# Patient Record
Sex: Male | Born: 1990 | Race: Black or African American | Hispanic: No | Marital: Single | Smoking: Current every day smoker
Health system: Southern US, Community
[De-identification: ages and names within clinical notes are randomized; demographics above are authoritative.]

## PROBLEM LIST (undated history)

## (undated) DIAGNOSIS — I10 Essential (primary) hypertension: Secondary | ICD-10-CM

## (undated) DIAGNOSIS — L720 Epidermal cyst: Secondary | ICD-10-CM

## (undated) HISTORY — DX: Epidermal cyst: L72.0

---

## 2014-12-14 ENCOUNTER — Emergency Department (HOSPITAL_COMMUNITY)
Admission: EM | Admit: 2014-12-14 | Discharge: 2014-12-14 | Disposition: A | Discharge status: Home / Self Care | Payer: Self-pay | Attending: Emergency Medicine | Admitting: Emergency Medicine

## 2014-12-14 ENCOUNTER — Encounter (HOSPITAL_COMMUNITY): Payer: Self-pay | Admitting: Emergency Medicine

## 2014-12-14 ENCOUNTER — Emergency Department (HOSPITAL_COMMUNITY): Payer: Self-pay

## 2014-12-14 DIAGNOSIS — K098 Other cysts of oral region, not elsewhere classified: Secondary | ICD-10-CM | POA: Insufficient documentation

## 2014-12-14 DIAGNOSIS — L720 Epidermal cyst: Secondary | ICD-10-CM

## 2014-12-14 DIAGNOSIS — Y92410 Unspecified street and highway as the place of occurrence of the external cause: Secondary | ICD-10-CM | POA: Insufficient documentation

## 2014-12-14 DIAGNOSIS — S92301A Fracture of unspecified metatarsal bone(s), right foot, initial encounter for closed fracture: Secondary | ICD-10-CM

## 2014-12-14 DIAGNOSIS — Y9389 Activity, other specified: Secondary | ICD-10-CM | POA: Insufficient documentation

## 2014-12-14 DIAGNOSIS — Z720 Tobacco use: Secondary | ICD-10-CM | POA: Insufficient documentation

## 2014-12-14 DIAGNOSIS — S92344A Nondisplaced fracture of fourth metatarsal bone, right foot, initial encounter for closed fracture: Secondary | ICD-10-CM | POA: Insufficient documentation

## 2014-12-14 DIAGNOSIS — Y998 Other external cause status: Secondary | ICD-10-CM | POA: Insufficient documentation

## 2014-12-14 MED ORDER — IBUPROFEN 800 MG PO TABS
800.0000 mg | ORAL_TABLET | Freq: Once | ORAL | Status: AC
  Administered 2014-12-14: 800 mg via ORAL
  Filled 2014-12-14: qty 1

## 2014-12-14 MED ORDER — IBUPROFEN 600 MG PO TABS: 600.0000 mg | ORAL_TABLET | Freq: Four times a day (QID) | ORAL | Status: DC | PRN
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
## 2014-12-14 NOTE — Discharge Instructions (Signed)
Wear a cam walker while awake. Follow-up with an orthopedist for further evaluation of your injury. Ice her foot 3-4 times per day. Take ibuprofen as prescribed for pain and swelling. Return to the emergency department as needed if symptoms worsen.  Metatarsal Fracture  A metatarsal fracture is a break (fracture) of one of the bones of the mid-foot (metatarsal bones). The metatarsal bones are responsible for maintaining the arch of the foot. There are three classifications of metatarsal fractures: dancer's fractures, Jones fractures, and stress fractures. A dancer's fracture is when a piece of bone is pulled off by a ligament or tendon (avulsion fracture) of the outer part of the foot (fifth metatarsal), near the joint with the ankle bones. A Jones fracture occurs in the middle of the fifth metatarsal. These fractures have limited ability to heal. A stress fracture occurs when the bone is slowly injured faster than it can repair itself. SYMPTOMS   Sharp pain, especially with standing or walking.  Tenderness, swelling, and later bruising (contusion) of the foot.  Numbness or paralysis from swelling in the foot, causing pressure on the blood vessels or nerves (uncommon). CAUSES  Fractures occur when a force is placed on the bone that is greater than it can handle. Common causes of injury include:  Direct hit (trauma) to the foot.  Twisting injury to the foot or ankle.  Landing on the foot and ankle in an improper position. RISK INCRESES WITH:  Participation in contact sports, sports that require jumping and landing, or sports in which cleats are worn and sliding occurs.  Previous foot or ankle sprains or dislocations.  Repeated injury to any joint in the foot.  Poor strength and flexibility. PREVENTION  Warm up and stretch properly before an activity.  Allow for adequate recovery between workouts.  Maintain physical fitness in:  Strength, flexibility, and  endurance.  Cardiovascular fitness.  When participating in jumping or contact sports, protect joints with supportive devices, such as wrapped elastic bandages, tape, braces, or high-top athletic shoes.  Wear properly fitted and padded protective equipment. PROGNOSIS If treated properly, metatarsal fractures usually heal well. Jones fractures have a higher risk of the bone failing to heal (nonunion). Sometimes, surgery is needed to heal Jones fractures.  RELATED COMPLICATIONS   Nonunion.  Fracture heals in a poor position (malunion).  Long-term (chronic) pain, stiffness, or swelling of the foot.  Excessive bleeding in the foot or at the dislocation site, causing pressure and injury to nerves and blood vessels (rare).  Unstable or arthritic joint following repeated injury or delayed treatment. TREATMENT  Treatment first involves the use of ice and medicine, to reduce pain and inflammation. If the bone fragments are out of alignment (displaced), then immediate realigning of the bones (reduction) is required. Fractures that cannot be realigned by hand, or where the bones protrude through the skin (open), may require surgery to hold the fracture in place with screws, pins, and plates. After the bones are in proper alignment, the foot and ankle must be restrained for 6 or more weeks. Restraint allows healing to occur. After restraint, it is important to perform strengthening and stretching exercises to help regain strength and a full range of motion. These exercises may be completed at home or with a therapist. A stiff-soled shoe and arch support (orthotic) may be required when first returning to sports. MEDICATION   If pain medicine is needed, nonsteroidal anti-inflammatory medicines (NSAIDS), or other minor pain relievers, are often advised.  Do not take pain  medicine for 7 days before surgery.  Only take over-the-counter or prescription medicines for pain, fever, or discomfort as directed by  your caregiver. COLD THERAPY  Cold treatment (icing) should be applied for 10 to 15 minutes every 2 to 3 hours for inflammations and pain, and immediately after activity that aggravates your symptoms. Use ice packs or ice massage. SEEK MEDICAL CARE IF:  Pain, tenderness, or swelling gets worse, despite treatment.  You experience pain, numbness, or coldness in the foot.  Blue, gray, or dark color appears in the toenails.  You or your child has an oral temperature above 102 F (38.9 C).  You have increased pain, swelling, and redness.  You have drainage of fluids or bleeding in the affected area.  New, unexplained symptoms develop. (Drugs used in treatment may produce side effects.)
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
## 2015-10-12 ENCOUNTER — Encounter (HOSPITAL_COMMUNITY): Payer: Self-pay | Admitting: Emergency Medicine

## 2015-10-12 ENCOUNTER — Emergency Department (HOSPITAL_COMMUNITY): Payer: Self-pay

## 2015-10-12 ENCOUNTER — Emergency Department (HOSPITAL_COMMUNITY)
Admission: EM | Admit: 2015-10-12 | Discharge: 2015-10-12 | Disposition: A | Discharge status: Home / Self Care | Payer: Self-pay | Attending: Emergency Medicine | Admitting: Emergency Medicine

## 2015-10-12 DIAGNOSIS — S8991XA Unspecified injury of right lower leg, initial encounter: Secondary | ICD-10-CM | POA: Insufficient documentation

## 2015-10-12 DIAGNOSIS — F17200 Nicotine dependence, unspecified, uncomplicated: Secondary | ICD-10-CM | POA: Insufficient documentation

## 2015-10-12 DIAGNOSIS — W500XXA Accidental hit or strike by another person, initial encounter: Secondary | ICD-10-CM | POA: Insufficient documentation

## 2015-10-12 DIAGNOSIS — Y9367 Activity, basketball: Secondary | ICD-10-CM | POA: Insufficient documentation

## 2015-10-12 DIAGNOSIS — Y998 Other external cause status: Secondary | ICD-10-CM | POA: Insufficient documentation

## 2015-10-12 DIAGNOSIS — M25561 Pain in right knee: Secondary | ICD-10-CM

## 2015-10-12 DIAGNOSIS — Y9289 Other specified places as the place of occurrence of the external cause: Secondary | ICD-10-CM | POA: Insufficient documentation

## 2015-10-12 MED ORDER — OXYCODONE-ACETAMINOPHEN 5-325 MG PO TABS
1.0000 | ORAL_TABLET | Freq: Once | ORAL | Status: AC
  Administered 2015-10-12: 1 via ORAL

## 2015-10-12 MED ORDER — OXYCODONE-ACETAMINOPHEN 5-325 MG PO TABS
ORAL_TABLET | ORAL | Status: AC
  Filled 2015-10-12: qty 1

## 2015-10-12 MED ORDER — NAPROXEN 500 MG PO TABS: 500.0000 mg | ORAL_TABLET | Freq: Two times a day (BID) | ORAL | Status: DC

## 2015-10-12 NOTE — ED Notes (Signed)
PA at bedside.

## 2015-10-12 NOTE — ED Provider Notes (Signed)
CSN: [id number]     Arrival date & time 10/12/15  0300 History   First MD Initiated Contact with Patient 10/12/15 [phone]     Chief Complaint  Patient presents with  . Knee Injury     (Consider location/radiation/quality/duration/timing/severity/associated sxs/prior Treatment) The history is provided by the patient and medical records. No language interpreter was used.     Alex Cox is a 25 y.o. male  with no pertinent PMH who presents to the Emergency Department complaining of acute onset of constant, moderate right knee pain x 1 day. Patient states yesterday he was playing basketball when another player hit his medial right knee causing him to fall. Patient states immediate pain, but no immediate swelling. Able to ambulate following the event. Aggravated by movement and weightbearing. Alleviated with rest. No medication taken prior to arrival. No history of prior injuries to this knee.  History reviewed. No pertinent past medical history. History reviewed. No pertinent past surgical history. Family History  Problem Relation Age of Onset  . Hypertension Other    Social History  Substance Use Topics  . Smoking status: Current Every Day Smoker  . Smokeless tobacco: None  . Alcohol Use: Yes    Review of Systems  Constitutional: Negative for fever and chills.  HENT: Negative for congestion and sore throat.   Eyes: Negative for visual disturbance.  Respiratory: Negative for cough and shortness of breath.   Cardiovascular: Negative.   Gastrointestinal: Negative for abdominal pain.  Genitourinary: Negative for dysuria.  Musculoskeletal: Positive for arthralgias.  Skin: Negative for color change.  Neurological: Negative for dizziness and weakness.      Allergies  Review of patient's allergies indicates no known allergies.  Home Medications   Prior to Admission medications   Medication Sig Start Date End Date Taking? Authorizing Provider  ibuprofen (ADVIL,MOTRIN) 600  MG tablet Take 1 tablet (600 mg total) by mouth every 6 (six) hours as needed. 12/14/14   Antony MaduraKelly Humes, PA-C  naproxen (NAPROSYN) 500 MG tablet Take 1 tablet (500 mg total) by mouth 2 (two) times daily. 10/12/15   Tyauna Lacaze Pilcher Annalese Stiner, PA-C   BP 148/107 mmHg  Pulse 84  Temp(Src) 98.9 F (37.2 C) (Oral)  Resp 14  Ht 6' (1.829 m)  Wt 71.668 kg  BMI 21.42 kg/m2  SpO2 99% Physical Exam  Constitutional: He is oriented to person, place, and time. He appears well-developed and well-nourished.  Alert and in no acute distress  HENT:  Head: Normocephalic and atraumatic.  Cardiovascular: Normal rate, regular rhythm and normal heart sounds.  Exam reveals no gallop and no friction rub.   No murmur heard. Pulmonary/Chest: Effort normal and breath sounds normal. No respiratory distress. He has no wheezes. He has no rales.  Musculoskeletal:  Right knee: No gross deformity noted. TTP along MCL. No varus/valgus laxity. Knee with full ROM. There is no joint effusion or swelling appreciated. No abnormal alignment or patellar mobility. No bruising, erythema, or warmth overlaying the joint.  Negative drawer's, negative Lachman's, negative McMurray's, no crepitus.  2+ DP pulse's bilaterally. All compartments are soft. Sensation intact distal to injury.  Neurological: He is alert and oriented to person, place, and time.  Skin: Skin is warm and dry.  Psychiatric: He has a normal mood and affect. His behavior is normal. Judgment and thought content normal.  Nursing note and vitals reviewed.   ED Course  Procedures (including critical care time) Labs Review Labs Reviewed - No data to display  Imaging Review Dg  Knee Complete 4 Views Right  10/12/2015  CLINICAL DATA:  Basketball injury yesterday. Medial right knee pain and swelling. EXAM: RIGHT KNEE - COMPLETE 4+ VIEW COMPARISON:  None. FINDINGS: There is no evidence of fracture, dislocation, or joint effusion. There is no evidence of arthropathy or other focal  bone abnormality. Soft tissues are unremarkable. IMPRESSION: Negative. Electronically Signed   By: Burman Nieves M.D.   On: 10/12/2015 04:39   I have personally reviewed and evaluated these images and lab results as part of my medical decision-making.   EKG Interpretation None      MDM   Final diagnoses:  Right knee pain   Alex Bridgeman presents with right knee pain secondary to injury yesterday while playing basketball. No immediate swelling and patient was able to ambulate immediately after incident. Patient is ambulatory in ED. X-rays were obtained which were negative. On exam, the contents intact. No valgus or varus laxity, however patient was tender to palpation over the MCL. Knee immobilizer placed in ED. Patient was given home care instructions. Orthopedic follow-up was discussed and strongly encouraged - referral given. Return precautions given. All questions answered.  [facility] Deedee Lybarger, PA-C 10/12/15 0734  Laurence Spates, MD 10/12/15 [phone]

## 2015-10-12 NOTE — Discharge Instructions (Signed)
Wear knee immobilizer for at least 2 weeks for stabilization of knee. Ice and elevate knee throughout the day. Naproxen for pain relief. Call orthopedic clinic today to schedule follow up appointment for recheck of ongoing knee pain in one week. That appointment can be canceled with a 24-48 hour notice if complete resolution of pain. Return to ER for new or worsening symptoms, any additional concerns.   COLD THERAPY DIRECTIONS:  Ice or gel packs can be used to reduce both pain and swelling. Ice is the most helpful within the first 24 to 48 hours after an injury or flareup from overusing a muscle or joint.  Ice is effective, has very few side effects, and is safe for most people to use.   If you expose your skin to cold temperatures for too long or without the proper protection, you can damage your skin or nerves. Watch for signs of skin damage due to cold.   HOME CARE INSTRUCTIONS  Follow these tips to use ice and cold packs safely.  Place a dry or damp towel between the ice and skin. A damp towel will cool the skin more quickly, so you may need to shorten the time that the ice is used.  For a more rapid response, add gentle compression to the ice.  Ice for no more than 10 to 20 minutes at a time. The bonier the area you are icing, the less time it will take to get the benefits of ice.  Check your skin after 5 minutes to make sure there are no signs of a poor response to cold or skin damage.  Rest 20 minutes or more in between uses.  Once your skin is numb, you can end your treatment. You can test numbness by very lightly touching your skin. The touch should be so light that you do not see the skin dimple from the pressure of your fingertip. When using ice, most people will feel these normal sensations in this order: cold, burning, aching, and numbness.

## 2015-10-12 NOTE — ED Notes (Signed)
Pt. injured his right knee while playing basketball ( collided with another player) yesterday , ambulatory , presents with pain and swelling .

## 2015-11-25 ENCOUNTER — Encounter (HOSPITAL_COMMUNITY): Payer: Self-pay

## 2015-11-25 ENCOUNTER — Emergency Department (HOSPITAL_COMMUNITY)
Admission: EM | Admit: 2015-11-25 | Discharge: 2015-11-25 | Disposition: A | Discharge status: Home / Self Care | Payer: Self-pay | Attending: Emergency Medicine | Admitting: Emergency Medicine

## 2015-11-25 DIAGNOSIS — S91311A Laceration without foreign body, right foot, initial encounter: Secondary | ICD-10-CM | POA: Insufficient documentation

## 2015-11-25 DIAGNOSIS — Z791 Long term (current) use of non-steroidal anti-inflammatories (NSAID): Secondary | ICD-10-CM | POA: Insufficient documentation

## 2015-11-25 DIAGNOSIS — Y9389 Activity, other specified: Secondary | ICD-10-CM | POA: Insufficient documentation

## 2015-11-25 DIAGNOSIS — S91114A Laceration without foreign body of right lesser toe(s) without damage to nail, initial encounter: Secondary | ICD-10-CM | POA: Insufficient documentation

## 2015-11-25 DIAGNOSIS — W25XXXA Contact with sharp glass, initial encounter: Secondary | ICD-10-CM | POA: Insufficient documentation

## 2015-11-25 DIAGNOSIS — Y9289 Other specified places as the place of occurrence of the external cause: Secondary | ICD-10-CM | POA: Insufficient documentation

## 2015-11-25 DIAGNOSIS — F17200 Nicotine dependence, unspecified, uncomplicated: Secondary | ICD-10-CM | POA: Insufficient documentation

## 2015-11-25 DIAGNOSIS — Y998 Other external cause status: Secondary | ICD-10-CM | POA: Insufficient documentation

## 2015-11-25 DIAGNOSIS — Z23 Encounter for immunization: Secondary | ICD-10-CM | POA: Insufficient documentation

## 2015-11-25 MED ORDER — IBUPROFEN 600 MG PO TABS: 600.0000 mg | ORAL_TABLET | Freq: Four times a day (QID) | ORAL | Status: DC | PRN

## 2015-11-25 MED ORDER — IBUPROFEN 800 MG PO TABS
800.0000 mg | ORAL_TABLET | Freq: Once | ORAL | Status: AC
  Administered 2015-11-25: 800 mg via ORAL
  Filled 2015-11-25: qty 1

## 2015-11-25 MED ORDER — LIDOCAINE-EPINEPHRINE 2 %-1:100000 IJ SOLN: 20.0000 mL | Freq: Once | INTRAMUSCULAR | Status: DC

## 2015-11-25 MED ORDER — TETANUS-DIPHTH-ACELL PERTUSSIS 5-2.5-18.5 LF-MCG/0.5 IM SUSP
0.5000 mL | Freq: Once | INTRAMUSCULAR | Status: AC
  Administered 2015-11-25: 0.5 mL via INTRAMUSCULAR
  Filled 2015-11-25: qty 0.5

## 2015-11-25 MED ORDER — LIDOCAINE-EPINEPHRINE (PF) 2 %-1:200000 IJ SOLN
INTRAMUSCULAR | Status: AC
  Administered 2015-11-25: 20 mL
  Filled 2015-11-25: qty 20

## 2015-11-25 NOTE — ED Notes (Signed)
PT DISCHARGED. INSTRUCTIONS AND PRESCRIPTION GIVEN. AAOX3. PT IN NO APPARENT DISTRESS OR PAIN. THE OPPORTUNITY TO ASK QUESTIONS WAS PROVIDED. 

## 2015-11-25 NOTE — Discharge Instructions (Signed)
Please have your sutures removed in 7 days.  Return sooner if you notice signs of infection.  Take ibuprofen as needed for pain.   Laceration Care, Adult A laceration is a cut that goes through all of the layers of the skin and into the tissue that is right under the skin. Some lacerations heal on their own. Others need to be closed with stitches (sutures), staples, skin adhesive strips, or skin glue. Proper laceration care minimizes the risk of infection and helps the laceration to heal better. HOW TO CARE FOR YOUR LACERATION If sutures or staples were used:  Keep the wound clean and dry.  If you were given a bandage (dressing), you should change it at least one time per day or as told by your health care provider. You should also change it if it becomes wet or dirty.  Keep the wound completely dry for the first 24 hours or as told by your health care provider. After that time, you may shower or bathe. However, make sure that the wound is not soaked in water until after the sutures or staples have been removed.  Clean the wound one time each day or as told by your health care provider:  Wash the wound with soap and water.  Rinse the wound with water to remove all soap.  Pat the wound dry with a clean towel. Do not rub the wound.  After cleaning the wound, apply a thin layer of antibiotic ointmentas told by your health care provider. This will help to prevent infection and keep the dressing from sticking to the wound.  Have the sutures or staples removed as told by your health care provider. If skin adhesive strips were used:  Keep the wound clean and dry.  If you were given a bandage (dressing), you should change it at least one time per day or as told by your health care provider. You should also change it if it becomes dirty or wet.  Do not get the skin adhesive strips wet. You may shower or bathe, but be careful to keep the wound dry.  If the wound gets wet, pat it dry with a  clean towel. Do not rub the wound.  Skin adhesive strips fall off on their own. You may trim the strips as the wound heals. Do not remove skin adhesive strips that are still stuck to the wound. They will fall off in time. If skin glue was used:  Try to keep the wound dry, but you may briefly wet it in the shower or bath. Do not soak the wound in water, such as by swimming.  After you have showered or bathed, gently pat the wound dry with a clean towel. Do not rub the wound.  Do not do any activities that will make you sweat heavily until the skin glue has fallen off on its own.  Do not apply liquid, cream, or ointment medicine to the wound while the skin glue is in place. Using those may loosen the film before the wound has healed.  If you were given a bandage (dressing), you should change it at least one time per day or as told by your health care provider. You should also change it if it becomes dirty or wet.  If a dressing is placed over the wound, be careful not to apply tape directly over the skin glue. Doing that may cause the glue to be pulled off before the wound has healed.  Do not pick at  the glue. The skin glue usually remains in place for 5-10 days, then it falls off of the skin. General Instructions  Take over-the-counter and prescription medicines only as told by your health care provider.  If you were prescribed an antibiotic medicine or ointment, take or apply it as told by your doctor. Do not stop using it even if your condition improves.  To help prevent scarring, make sure to cover your wound with sunscreen whenever you are outside after stitches are removed, after adhesive strips are removed, or when glue remains in place and the wound is healed. Make sure to wear a sunscreen of at least 30 SPF.  Do not scratch or pick at the wound.  Keep all follow-up visits as told by your health care provider. This is important.  Check your wound every day for signs of infection.  Watch for:  Redness, swelling, or pain.  Fluid, blood, or pus.  Raise (elevate) the injured area above the level of your heart while you are sitting or lying down, if possible. SEEK MEDICAL CARE IF:  You received a tetanus shot and you have swelling, severe pain, redness, or bleeding at the injection site.  You have a fever.  A wound that was closed breaks open.  You notice a bad smell coming from your wound or your dressing.  You notice something coming out of the wound, such as wood or glass.  Your pain is not controlled with medicine.  You have increased redness, swelling, or pain at the site of your wound.  You have fluid, blood, or pus coming from your wound.  You notice a change in the color of your skin near your wound.  You need to change the dressing frequently due to fluid, blood, or pus draining from the wound.  You develop a new rash.  You develop numbness around the wound. SEEK IMMEDIATE MEDICAL CARE IF:  You develop severe swelling around the wound.  Your pain suddenly increases and is severe.  You develop painful lumps near the wound or on skin that is anywhere on your body.  You have a red streak going away from your wound.  The wound is on your hand or foot and you cannot properly move a finger or toe.  The wound is on your hand or foot and you notice that your fingers or toes look pale or bluish.   This information is not intended to replace advice given to you by your health care provider. Make sure you discuss any questions you have with your health care provider.   Document Released: 07/21/2005 Document Revised: 12/05/2014 Document Reviewed: 07/17/2014 Elsevier Interactive Patient Education [employer]
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
## 2015-11-25 NOTE — ED Provider Notes (Signed)
CSN: [id number]     Arrival date & time 11/25/15  1515 History  By signing my name below, I, Linna Darner, attest that this documentation has been prepared under the direction and in the presence of non-physician practitioner, Fayrene Helper, PA-C. Electronically Signed: Linna Darner, Scribe. 11/25/2015. 3:19 PM.    Chief Complaint  Patient presents with  . Extremity Laceration    LEFT FOOT    The history is provided by the patient. No language interpreter was used.     HPI Comments: Alex Cox is a 25 y.o. male brought in by EMS with no pertinent PMHx who presents to the Emergency Department complaining of 3 bottom right foot lacerations s/p stepping on a glass ash tray several hours ago. He reports that the ash tray broke when he stepped on it and punctured the bottom of his right foot in several places; he has one laceration on his 2nd right digit, one on the ball of his right foot, and one on the mid-sole of his right foot. Pt states that it does not feel like there is glass in his lacerations. He endorses 7/10 pain and mild numbness in his right foot; he is able to move his right toes. He has no known allergies to medication. His bleeding is controlled at this time. Pt is not UTD for tetanus. He denies other pains, injuries, or associated symptoms.  No past medical history on file. No past surgical history on file. Family History  Problem Relation Age of Onset  . Hypertension Other    Social History  Substance Use Topics  . Smoking status: Current Every Day Smoker  . Smokeless tobacco: Not on file  . Alcohol Use: Yes    Review of Systems  Musculoskeletal: Positive for arthralgias (right foot).  Skin: Positive for wound (right foot).  Neurological: Positive for numbness.   Allergies  Review of patient's allergies indicates no known allergies.  Home Medications   Prior to Admission medications   Medication Sig Start Date End Date Taking? Authorizing Provider   ibuprofen (ADVIL,MOTRIN) 600 MG tablet Take 1 tablet (600 mg total) by mouth every 6 (six) hours as needed. 12/14/14   Antony Madura, PA-C  naproxen (NAPROSYN) 500 MG tablet Take 1 tablet (500 mg total) by mouth 2 (two) times daily. 10/12/15   Chase Picket Ward, PA-C   There were no vitals taken for this visit. Physical Exam  Constitutional: He is oriented to person, place, and time. He appears well-developed and well-nourished. No distress.  HENT:  Head: Normocephalic and atraumatic.  Eyes: Conjunctivae and EOM are normal.  Neck: Neck supple. No tracheal deviation present.  Cardiovascular: Normal rate.   Pulmonary/Chest: Effort normal. No respiratory distress.  Musculoskeletal: Normal range of motion.  2 cm superficial laceration noted to the mid-sole of right foot without any foreign object noted, not actively bleeding, depth of 4 mm 2.5 cm oblique laceration noted to the ball of the right foot, approximately 3 mm in depth without any foreign object noted and not actively bleeding 3 cm linear superficial laceration noted to the medial aspect of the 2nd toe without foreign object noted, 2 mm in depth, not actively bleeding  Neurological: He is alert and oriented to person, place, and time.  Skin: Skin is warm and dry.  Psychiatric: He has a normal mood and affect. His behavior is normal.  Nursing note and vitals reviewed.   ED Course  Procedures (including critical care time)  DIAGNOSTIC STUDIES: Oxygen Saturation is 99%  on RA, normal by my interpretation.    COORDINATION OF CARE: 3:19 PM Will administer ibuprofen 800 mg tablet and Xylocaine w/ Epi injection. Will order suture cart. Discussed treatment plan with pt at bedside and pt agreed to plan.  LACERATION REPAIR Performed by: Fayrene HelperBowie Alda Gaultney Consent: Verbal consent obtained. Risks and benefits: risks, benefits and alternatives were discussed Patient identity confirmed: provided demographic data Time out performed prior to  procedure Prepped and Draped in normal sterile fashion Wound explored Laceration Location: R foot, mid sole Laceration Length: 2cm No Foreign Bodies seen or palpated Anesthesia: local infiltration Local anesthetic: lidocaine 2% w epinephrine Anesthetic total: 2 ml Irrigation method: syringe Amount of cleaning: standard Skin closure: prolene 5.0 Number of sutures or staples: 4 Technique: simple interrupted Patient tolerance: Patient tolerated the procedure well with no immediate complications.  LACERATION REPAIR Performed by: Fayrene HelperRAN,Elaine Middleton Authorized byFayrene Helper: Brysten Reister Consent: Verbal consent obtained. Risks and benefits: risks, benefits and alternatives were discussed Consent given by: patient Patient identity confirmed: provided demographic data Prepped and Draped in normal sterile fashion Wound explored  Laceration Location: R foot, ball of foot  Laceration Length: 2.5cm  No Foreign Bodies seen or palpated  Anesthesia: local infiltration  Local anesthetic: lidocaine 2% w epinephrine  Anesthetic total: 2 ml  Irrigation method: syringe Amount of cleaning: standard  Skin closure: prolene 5.0  Number of sutures: 5  Technique: simple interrupted  Patient tolerance: Patient tolerated the procedure well with no immediate complications.  LACERATION REPAIR Performed by: Fayrene HelperRAN,Nakima Fluegge Authorized byFayrene Helper: Laurna Shetley Consent: Verbal consent obtained. Risks and benefits: risks, benefits and alternatives were discussed Consent given by: patient Patient identity confirmed: provided demographic data Prepped and Draped in normal sterile fashion Wound explored  Laceration Location: R foot, 2nd toe, medial aspect  Laceration Length: 3cm  No Foreign Bodies seen or palpated  Anesthesia: local infiltration  Local anesthetic: lidocaine 2% w epinephrine  Anesthetic total: 1 ml  Irrigation method: syringe Amount of cleaning: standard  Skin closure: sterile strip  Number of  sutures: sterile strip  Technique: sterile strip  Patient tolerance: Patient tolerated the procedure well with no immediate complications.     MDM   Final diagnoses:  Foot laceration, right, initial encounter   BP 135/91 mmHg  Pulse 72  Temp(Src) 98.3 F (36.8 C) (Oral)  Resp 16  Ht 6' (1.829 m)  Wt 70.308 kg  BMI 21.02 kg/m2  SpO2 99%  I personally performed the services described in this documentation, which was scribed in my presence. The recorded information has been reviewed and is accurate.     Fayrene HelperBowie Vernell Townley, PA-C 11/25/15 1619  Lavera Guiseana Duo Liu, MD 11/26/15 1149

## 2015-11-25 NOTE — ED Notes (Signed)
Bed: [id number] Expected date:  Expected time:  Means of arrival:  Comments: EMS- foot lac

## 2015-12-02 ENCOUNTER — Encounter (HOSPITAL_COMMUNITY): Payer: Self-pay | Admitting: *Deleted

## 2015-12-02 ENCOUNTER — Emergency Department (HOSPITAL_COMMUNITY)
Admission: EM | Admit: 2015-12-02 | Discharge: 2015-12-02 | Disposition: A | Discharge status: Home / Self Care | Payer: Self-pay | Attending: Emergency Medicine | Admitting: Emergency Medicine

## 2015-12-02 DIAGNOSIS — Z4802 Encounter for removal of sutures: Secondary | ICD-10-CM | POA: Insufficient documentation

## 2015-12-02 DIAGNOSIS — F17200 Nicotine dependence, unspecified, uncomplicated: Secondary | ICD-10-CM | POA: Insufficient documentation

## 2015-12-02 NOTE — ED Notes (Signed)
Pt told registration he does not want to wait longer, states he will come back tomorrow.

## 2015-12-02 NOTE — ED Notes (Signed)
Pt here for suture removal. Pt has sutures on bottom of right foot. Pt denies drainage from suture site.

## 2015-12-03 ENCOUNTER — Emergency Department (HOSPITAL_COMMUNITY)
Admission: EM | Admit: 2015-12-03 | Discharge: 2015-12-03 | Disposition: A | Discharge status: Home / Self Care | Payer: Self-pay | Attending: Emergency Medicine | Admitting: Emergency Medicine

## 2015-12-03 ENCOUNTER — Encounter (HOSPITAL_COMMUNITY): Payer: Self-pay

## 2015-12-03 DIAGNOSIS — Z4802 Encounter for removal of sutures: Secondary | ICD-10-CM

## 2015-12-03 DIAGNOSIS — Z791 Long term (current) use of non-steroidal anti-inflammatories (NSAID): Secondary | ICD-10-CM | POA: Insufficient documentation

## 2015-12-03 NOTE — Discharge Instructions (Signed)
Suture Removal, Care After Refer to this sheet in the next few weeks. These instructions provide you with information on caring for yourself after your procedure. Your health care provider may also give you more specific instructions. Your treatment has been planned according to current medical practices, but problems sometimes occur. Call your health care provider if you have any problems or questions after your procedure. WHAT TO EXPECT AFTER THE PROCEDURE After your stitches (sutures) are removed, it is typical to have the following:  Some discomfort and swelling in the wound area.  Slight redness in the area. HOME CARE INSTRUCTIONS   If you have skin adhesive strips over the wound area, do not take the strips off. They will fall off on their own in a few days. If the strips remain in place after 14 days, you may remove them.  Change any bandages (dressings) at least once a day or as directed by your health care provider. If the bandage sticks, soak it off with warm, soapy water.  Apply cream or ointment only as directed by your health care provider. If using cream or ointment, wash the area with soap and water 2 times a day to remove all the cream or ointment. Rinse off the soap and pat the area dry with a clean towel.  Keep the wound area dry and clean. If the bandage becomes wet or dirty, or if it develops a bad smell, change it as soon as possible.  Continue to protect the wound from injury.  Use sunscreen when out in the sun. New scars become sunburned easily. SEEK MEDICAL CARE IF:  You have increasing redness, swelling, or pain in the wound.  You see pus coming from the wound.  You have a fever.  You notice a bad smell coming from the wound or dressing.  Your wound breaks open (edges not staying together).   This information is not intended to replace advice given to you by your health care provider. Make sure you discuss any questions you have with your health care  provider.   Apply antibiotic ointment to wounds daily. Keep covered with a bandaid while wearing a shoe. Return to the ED if you experience redness or swelling around your wound, fevers, chills.

## 2015-12-03 NOTE — ED Provider Notes (Signed)
CSN: [id number]     Arrival date & time 12/03/15  1109 History  By signing my name below, I, Alex BeardChristian Cox, attest that this documentation has been prepared under the direction and in the presence of Texas InstrumentsSamantha Tripp Naly Schwanz, PA-C.   Electronically Signed: Iona Beardhristian Cox, ED Scribe 12/03/2015 at 12:20 PM.  Chief Complaint  Patient presents with  . Suture / Staple Removal    The history is provided by the patient. No language interpreter was used.   HPI Comments: Alex Cox is a 25 y.o. male with no pertinent PMHx who presents to the Emergency Department for suture removal on the bottom of his right foot. Pt reports the sutures were put in one week ago after he stepped on glass. No other associated symptoms noted. No worsening or alleviating factors noted. Pt denies fever, erythema, drainage, or any other pertinent symptoms. Pt is UTD on his tetanus.   History reviewed. No pertinent past medical history. History reviewed. No pertinent past surgical history. Family History  Problem Relation Age of Onset  . Hypertension Other    Social History  Substance Use Topics  . Smoking status: Current Every Day Smoker  . Smokeless tobacco: None  . Alcohol Use: Yes    Review of Systems  Constitutional: Negative for fever.  Skin: Negative for color change.       Sutures to bottom of right foot  All other systems reviewed and are negative.    Allergies  Review of patient's allergies indicates no known allergies.  Home Medications   Prior to Admission medications   Medication Sig Start Date End Date Taking? Authorizing Provider  ibuprofen (ADVIL,MOTRIN) 600 MG tablet Take 1 tablet (600 mg total) by mouth every 6 (six) hours as needed. 11/25/15   Fayrene HelperBowie Tran, PA-C  naproxen (NAPROSYN) 500 MG tablet Take 1 tablet (500 mg total) by mouth 2 (two) times daily. 10/12/15   Jaime Pilcher Ward, PA-C   BP 130/96 mmHg  Pulse 78  Temp(Src) 98.3 F (36.8 C)  Resp 16  Ht 6' (1.829 m)  Wt  155 lb (70.308 kg)  BMI 21.02 kg/m2  SpO2 99% Physical Exam  Constitutional: He is oriented to person, place, and time. He appears well-developed and well-nourished. No distress.  HENT:  Head: Normocephalic and atraumatic.  Eyes: Conjunctivae are normal. Right eye exhibits no discharge. Left eye exhibits no discharge. No scleral icterus.  Cardiovascular: Normal rate.   Pulmonary/Chest: Effort normal.  Neurological: He is alert and oriented to person, place, and time. Coordination normal.  Skin: Skin is warm and dry. No rash noted. He is not diaphoretic. No erythema. No pallor.  9 sutures on the sole of right foot. Wound appears well healed. No erythema or swelling. Wound clean, dry, and intact.   Psychiatric: He has a normal mood and affect. His behavior is normal.  Nursing note and vitals reviewed.   ED Course  Procedures (including critical care time) DIAGNOSTIC STUDIES: Oxygen Saturation is 99% on RA, normal by my interpretation.    COORDINATION OF CARE: 12:20 PM Discussed treatment plan with pt at bedside and pt agreed to plan.  Labs Review Labs Reviewed - No data to display  Imaging Review No results found.   EKG Interpretation None      MDM    Final diagnoses:  Visit for suture removal   Pt to ER for staple/suture removal and wound check as above. Procedure tolerated well. Vitals normal, no signs of infection. Scar minimization & return precautions  given at dc.    I personally performed the services described in this documentation, which was scribed in my presence. The recorded information has been reviewed and is accurate.       Lester Kinsman Ladora, PA-C 12/03/15 1303

## 2015-12-03 NOTE — ED Notes (Signed)
Patient here for suture removal of the right foot.

## 2015-12-03 NOTE — ED Notes (Signed)
Bed: [facility] Expected date:  Expected time:  Means of arrival:  Comments: 

## 2016-02-14 NOTE — ED Provider Notes (Signed)
Medical screening examination/treatment/procedure(s) were performed by non-physician practitioner and as supervising physician I was immediately available for consultation/collaboration.   EKG Interpretation None       Ashton Sabine, MD 02/14/16 1301 

## 2016-03-04 ENCOUNTER — Emergency Department (HOSPITAL_COMMUNITY)
Admission: EM | Admit: 2016-03-04 | Discharge: 2016-03-04 | Disposition: A | Discharge status: Home / Self Care | Payer: Self-pay | Attending: Emergency Medicine | Admitting: Emergency Medicine

## 2016-03-04 ENCOUNTER — Encounter (HOSPITAL_COMMUNITY): Payer: Self-pay | Admitting: *Deleted

## 2016-03-04 DIAGNOSIS — F1290 Cannabis use, unspecified, uncomplicated: Secondary | ICD-10-CM | POA: Insufficient documentation

## 2016-03-04 DIAGNOSIS — L723 Sebaceous cyst: Secondary | ICD-10-CM | POA: Insufficient documentation

## 2016-03-04 DIAGNOSIS — Z791 Long term (current) use of non-steroidal anti-inflammatories (NSAID): Secondary | ICD-10-CM | POA: Insufficient documentation

## 2016-03-04 DIAGNOSIS — F17200 Nicotine dependence, unspecified, uncomplicated: Secondary | ICD-10-CM | POA: Insufficient documentation

## 2016-03-04 NOTE — ED Provider Notes (Signed)
[facility]-EMERGENCY DEPT Provider Note   CSN: [id number] Arrival date & time: 03/04/16  1718  First Provider Contact:  None    By signing my name below, I, Alex Cox, attest that this documentation has been prepared under the direction and in the presence of non-physician practitioner, Alex Fowler, PA-C. Electronically Signed: Majel Cox, Scribe. 03/04/2016. 7:31 PM.  History   Chief Complaint Chief Complaint  Patient presents with  . Cyst   The history is provided by the patient. No language interpreter was used.    HPI Comments: Alex Cox is a 25 y.o. male who presents to the Emergency Department complaining of gradually worsening, constant, non-painful knot on the top of his head that appeared ~1 year ago and has become larger over the course of the last year. Pt reports he believes it is causing his headaches at night; he denies a headache now in the ED. He also denies any drainage from the knot and associated pain. No numbness or weakness.  No prior treatment.  No modifying or aggravating factors.   History reviewed. No pertinent past medical history.  There are no active problems to display for this patient.   History reviewed. No pertinent surgical history.   Home Medications    Prior to Admission medications   Medication Sig Start Date End Date Taking? Authorizing Provider  ibuprofen (ADVIL,MOTRIN) 600 MG tablet Take 1 tablet (600 mg total) by mouth every 6 (six) hours as needed. 11/25/15   Fayrene Helper, PA-C  naproxen (NAPROSYN) 500 MG tablet Take 1 tablet (500 mg total) by mouth 2 (two) times daily. 10/12/15   Chase Picket Ward, PA-C    Family History Family History  Problem Relation Age of Onset  . Hypertension Other     Social History Social History  Substance Use Topics  . Smoking status: Current Every Day Smoker  . Smokeless tobacco: Never Used  . Alcohol use Yes     Allergies   Review of patient's allergies indicates no known allergies.   Review  of Systems Review of Systems  Constitutional: Negative for fever.  Neurological: Positive for headaches.  All other systems reviewed and are negative.  Physical Exam Updated Vital Signs BP (!) 139/102 (BP Location: Right Arm)   Pulse 83   Temp 98.9 F (37.2 C) (Oral)   Resp 18   SpO2 100%   Physical Exam  Constitutional: He is oriented to person, place, and time. He appears well-developed and well-nourished.  HENT:  Head: Normocephalic and atraumatic.  Right Ear: External ear normal.  Left Ear: External ear normal.  1x1 cm non-tender, mobile, rubbery-soft mass on middle upper forehead.  No overlying erythema, warmth, induration, or fluctuance.   Eyes: Conjunctivae are normal. No scleral icterus.  Neck: No tracheal deviation present.  Pulmonary/Chest: Effort normal. No respiratory distress.  Abdominal: He exhibits no distension.  Musculoskeletal: Normal range of motion.  Neurological: He is alert and oriented to person, place, and time.  Skin: Skin is warm and dry.  Psychiatric: He has a normal mood and affect. His behavior is normal.   ED Treatments / Results  Labs (all labs ordered are listed, but only abnormal results are displayed) Labs Reviewed - No data to display  EKG  EKG Interpretation None       Radiology No results found.  Procedures Procedures  DIAGNOSTIC STUDIES:  Oxygen Saturation is 100% on RA, normal by my interpretation.    COORDINATION OF CARE:  7:31 PM Discussed treatment plan with pt  at bedside and pt agreed to plan.   Medications Ordered in ED Medications - No data to display   Initial Impression / Assessment and Plan / ED Course  I have reviewed the triage vital signs and the nursing notes.  Pertinent labs & imaging results that were available during my care of the patient were reviewed by me and considered in my medical decision making (see chart for details).  Clinical Course   Patient presents with likely lipoma versus  sebaceous cyst.  No signs of infection.  No indication for removal at this time.  Will refer to general surgery for elective removal.  Return precautions discussed.  Patient agrees and acknowledges the above plan for discharge.    Final Clinical Impressions(s) / ED Diagnoses   Final diagnoses:  Sebaceous cyst    New Prescriptions New Prescriptions   No medications on file   I personally performed the services described in this documentation, which was scribed in my presence. The recorded information has been reviewed and is accurate.     Alex Fowler, PA-C 03/04/16 1948    Rolan Bucco, MD 03/04/16 [phone]

## 2016-03-04 NOTE — ED Triage Notes (Signed)
Pt complains of knot on his upper forehead for the past year, which has gotten bigger over the past month. Pt states he thinks the knot is giving him headaches. Pt denies headache at this time. Pt denies drainage from knot.

## 2016-03-17 ENCOUNTER — Emergency Department (HOSPITAL_COMMUNITY)
Admission: EM | Admit: 2016-03-17 | Discharge: 2016-03-17 | Disposition: A | Discharge status: Home / Self Care | Payer: Self-pay | Attending: Emergency Medicine | Admitting: Emergency Medicine

## 2016-03-17 ENCOUNTER — Emergency Department (HOSPITAL_COMMUNITY): Payer: Self-pay

## 2016-03-17 ENCOUNTER — Encounter (HOSPITAL_COMMUNITY): Payer: Self-pay | Admitting: Emergency Medicine

## 2016-03-17 DIAGNOSIS — Y999 Unspecified external cause status: Secondary | ICD-10-CM | POA: Insufficient documentation

## 2016-03-17 DIAGNOSIS — R569 Unspecified convulsions: Secondary | ICD-10-CM | POA: Insufficient documentation

## 2016-03-17 DIAGNOSIS — S0083XA Contusion of other part of head, initial encounter: Secondary | ICD-10-CM | POA: Insufficient documentation

## 2016-03-17 DIAGNOSIS — W1830XA Fall on same level, unspecified, initial encounter: Secondary | ICD-10-CM | POA: Insufficient documentation

## 2016-03-17 DIAGNOSIS — I10 Essential (primary) hypertension: Secondary | ICD-10-CM | POA: Insufficient documentation

## 2016-03-17 DIAGNOSIS — F17200 Nicotine dependence, unspecified, uncomplicated: Secondary | ICD-10-CM | POA: Insufficient documentation

## 2016-03-17 DIAGNOSIS — Y929 Unspecified place or not applicable: Secondary | ICD-10-CM | POA: Insufficient documentation

## 2016-03-17 DIAGNOSIS — Y939 Activity, unspecified: Secondary | ICD-10-CM | POA: Insufficient documentation

## 2016-03-17 DIAGNOSIS — M25512 Pain in left shoulder: Secondary | ICD-10-CM | POA: Insufficient documentation

## 2016-03-17 HISTORY — DX: Essential (primary) hypertension: I10

## 2016-03-17 LAB — CBC
HCT: 42.5 % (ref 39.0–52.0)
Hemoglobin: 14.2 g/dL (ref 13.0–17.0)
MCH: 31.6 pg (ref 26.0–34.0)
MCHC: 33.4 g/dL (ref 30.0–36.0)
MCV: 94.4 fL (ref 78.0–100.0)
PLATELETS: 178 10*3/uL (ref 150–400)
RBC: 4.5 MIL/uL (ref 4.22–5.81)
RDW: 14 % (ref 11.5–15.5)
WBC: 6 10*3/uL (ref 4.0–10.5)

## 2016-03-17 LAB — BASIC METABOLIC PANEL
Anion gap: 10 (ref 5–15)
BUN: 11 mg/dL (ref 6–20)
CALCIUM: 9.7 mg/dL (ref 8.9–10.3)
CHLORIDE: 102 mmol/L (ref 101–111)
CO2: 24 mmol/L (ref 22–32)
CREATININE: 1.43 mg/dL — AB (ref 0.61–1.24)
GFR calc Af Amer: >60 mL/min (ref >60)
GFR calc non Af Amer: >60 mL/min (ref >60)
Glucose, Bld: 92 mg/dL (ref 65–99)
Potassium: 3.6 mmol/L (ref 3.5–5.1)
Sodium: 136 mmol/L (ref 135–145)

## 2016-03-17 LAB — MAGNESIUM: MAGNESIUM: 2.8 mg/dL — AB (ref 1.7–2.4)

## 2016-03-17 MED ORDER — LEVETIRACETAM 500 MG PO TABS: 500.0000 mg | ORAL_TABLET | Freq: Two times a day (BID) | ORAL | 2 refills | Status: DC

## 2016-03-17 MED ORDER — LEVETIRACETAM 500 MG PO TABS
500.0000 mg | ORAL_TABLET | Freq: Once | ORAL | Status: AC
  Administered 2016-03-17: 500 mg via ORAL
  Filled 2016-03-17: qty 1

## 2016-03-17 MED ORDER — SODIUM CHLORIDE 0.9 % IV BOLUS (SEPSIS)
1000.0000 mL | Freq: Once | INTRAVENOUS | Status: AC
  Administered 2016-03-17: 1000 mL via INTRAVENOUS

## 2016-03-17 NOTE — ED Notes (Signed)
Patient Alert and oriented X4. Stable and ambulatory. Patient verbalized understanding of the discharge instructions.  Patient belongings were taken by the patient.  

## 2016-03-17 NOTE — ED Provider Notes (Signed)
[facility]-EMERGENCY DEPT Provider Note   CSN: [id number] Arrival date & time: 03/17/16  1532     History   Chief Complaint Chief Complaint  Patient presents with  . Seizures    HPI Alex Cox is a 25 y.o. male.  Patient states that he was in his normal state of health today. He was in the kitchen in through something away in the trash and when he sat back down that's all he remembered.  Bystanders state he stood up and fell to the floor hitting his head and then started shaking diffusely. This lasted 1-2 minutes and then he had a period of 20 minutes of confusion and combativeness which resolved when EMS arrived. He also had one episode of emesis but currently he denies nausea or headache. He is complaining of left shoulder pain since the seizure. He did have a seizure 2 years ago and at that time after being seen in the emergency department he was told it was alcohol related. However he states he has had his typical amount of alcohol over the last few days and has not ceased using alcohol. He also smokes marijuana but that has not changed in the last 9 years. He denies any over-the-counter or prescription medication use   The history is provided by the patient.  Seizures   This is a recurrent problem. The current episode started 1 to 2 hours ago. The problem has been resolved. There was 1 seizure. The most recent episode lasted 2 to 5 minutes. Associated symptoms include confusion and vomiting. Pertinent negatives include no visual disturbance, no chest pain, no cough and no diarrhea. Characteristics include rhythmic jerking and loss of consciousness. The episode was witnessed. There was no sensation of an aura present. The seizures did not continue in the ED. The seizure(s) had no focality. Possible causes do not include medication or dosage change, sleep deprivation, recent illness or change in alcohol use. There has been no fever. There were no medications administered prior to arrival.     Past Medical History:  Diagnosis Date  . Hypertension     There are no active problems to display for this patient.   No past surgical history on file.     Home Medications    Prior to Admission medications   Medication Sig Start Date End Date Taking? Authorizing Provider  ibuprofen (ADVIL,MOTRIN) 600 MG tablet Take 1 tablet (600 mg total) by mouth every 6 (six) hours as needed. 11/25/15   Fayrene Helper, PA-C  naproxen (NAPROSYN) 500 MG tablet Take 1 tablet (500 mg total) by mouth 2 (two) times daily. 10/12/15   Chase Picket Ward, PA-C    Family History Family History  Problem Relation Age of Onset  . Hypertension Other     Social History Social History  Substance Use Topics  . Smoking status: Current Every Day Smoker  . Smokeless tobacco: Never Used  . Alcohol use Yes     Allergies   Review of patient's allergies indicates no known allergies.   Review of Systems Review of Systems  Eyes: Negative for visual disturbance.  Respiratory: Negative for cough.   Cardiovascular: Negative for chest pain.  Gastrointestinal: Positive for vomiting. Negative for diarrhea.  Neurological: Positive for seizures and loss of consciousness.  Psychiatric/Behavioral: Positive for confusion.  All other systems reviewed and are negative.    Physical Exam Updated Vital Signs BP 138/99   Pulse 78   Resp 12   Ht 6' (1.829 m)   Wt 155  lb (70.3 kg)   SpO2 97%   BMI 21.02 kg/m   Physical Exam  Constitutional: He is oriented to person, place, and time. He appears well-developed and well-nourished. No distress.  HENT:  Head: Normocephalic. Head is with contusion.    Mouth/Throat: Oropharynx is clear and moist.  Eyes: Conjunctivae and EOM are normal. Pupils are equal, round, and reactive to light.  Neck: Normal range of motion. Neck supple.  Cardiovascular: Normal rate, regular rhythm and intact distal pulses.   No murmur heard. Pulmonary/Chest: Effort normal and breath  sounds normal. No respiratory distress. He has no wheezes. He has no rales.  Abdominal: Soft. He exhibits no distension. There is no tenderness. There is no rebound and no guarding.  Musculoskeletal: Normal range of motion. He exhibits no edema.       Left shoulder: He exhibits tenderness and bony tenderness. He exhibits normal range of motion, no swelling, no deformity, normal pulse and normal strength.  Neurological: He is alert and oriented to person, place, and time. He has normal strength. No cranial nerve deficit or sensory deficit. Gait normal.  Skin: Skin is warm and dry. No rash noted. No erythema.  Psychiatric: He has a normal mood and affect. His behavior is normal.  Nursing note and vitals reviewed.    ED Treatments / Results  Labs (all labs ordered are listed, but only abnormal results are displayed) Labs Reviewed  BASIC METABOLIC PANEL - Abnormal; Notable for the following:       Result Value   Creatinine, Ser 1.43 (*)    All other components within normal limits  MAGNESIUM - Abnormal; Notable for the following:    Magnesium 2.8 (*)    All other components within normal limits  CBC  URINE RAPID DRUG SCREEN, HOSP PERFORMED  CBG MONITORING, ED    EKG  EKG Interpretation  Date/Time:  Monday March 17 2016 15:33:45 EDT Ventricular Rate:  76 PR Interval:    QRS Duration: 108 QT Interval:  407 QTC Calculation: 458 R Axis:   83 Text Interpretation:  Age not entered, assumed to be  25 years old for purpose of ECG interpretation Sinus rhythm Probable left ventricular hypertrophy ST elev, probable normal early repol pattern Confirmed by Lincoln Brighamees, Liz [phone]([id number]) on 03/17/2016 3:50:52 PM       Radiology Ct Head Wo Contrast  Result Date: 03/17/2016 CLINICAL DATA:  Fall following seizure EXAM: CT HEAD WITHOUT CONTRAST TECHNIQUE: Contiguous axial images were obtained from the base of the skull through the vertex without intravenous contrast. COMPARISON:  None FINDINGS: Brain: The  ventricles are normal in size and configuration. Cisterna magna is mildly prominent, an anatomic variant. There is no apparent intracranial mass, hemorrhage, extra-axial fluid collection, or midline shift. Gray-white compartments appear normal. No acute infarct evident. Vascular: No hyperdense vessel. No large tibial vascular calcification evident. Skull: Bony calvarium appears intact. Sinuses/Orbits: Orbits appear symmetric bilaterally. There is mucosal thickening with retention cysts in the posterior right sphenoid sinus region. Other visualized paranasal sinuses clear. Other: Mastoid air cells are clear. There is a small sebaceous cyst in the midline frontal region measuring 1.2 x 0.4 cm.1 IMPRESSION: No intracranial mass, hemorrhage, or extra-axial fluid collection. Gray-white compartments are normal. Right-sided sphenoid sinus disease noted. Small sebaceous cyst midline frontal region. Electronically Signed   By: Bretta BangWilliam  Woodruff III M.D.   On: 03/17/2016 18:47   Dg Shoulder Left  Result Date: 03/17/2016 CLINICAL DATA:  Pain following seizure with fall EXAM: LEFT SHOULDER -  2+ VIEW COMPARISON:  None. FINDINGS: Frontal, Y scapular, and axillary images were obtained. There is no fracture or dislocation. The joint spaces appear normal. No erosive change or intra-articular calcification. Visualized left lung is clear. IMPRESSION: No demonstrable fracture or dislocation.  No apparent arthropathy. Electronically Signed   By: Bretta BangWilliam  Woodruff III M.D.   On: 03/17/2016 18:05    Procedures Procedures (including critical care time)  Medications Ordered in ED Medications  sodium chloride 0.9 % bolus 1,000 mL (not administered)     Initial Impression / Assessment and Plan / ED Course  I have reviewed the triage vital signs and the nursing notes.  Pertinent labs & imaging results that were available during my care of the patient were reviewed by me and considered in my medical decision making (see chart  for details).  Clinical Course   Patient is a 25 year old healthy male presenting today with what sounds like his second seizure. First seizure was approximately 2 years ago today patient had tonic-clonic movement with a postictal period that started spontaneously without an aura. He has a small hematoma present over his right forehead as well as complaining of left shoulder pain. He is currently neurovascularly intact. Patient does use marijuana and drinks 1/5 of alcohol every 2 days but those things have not changed in he's been doing them for about 9 years. Patient denies any sleep deprivation, new stress or prescription medications.  He currently is well-appearing and back to baseline. CBC, BMP, magnesium, head CT and left shoulder imaging pending. If all is normal this is patient's second seizure and will discuss with neurology but may need to start an anticonvulsant. Also patient was instructed that he is unable to drive at this time until following up with neurology.  Labs without significant findings. Patient was started on Keppra per Dr. Alene MiresKirkpatrick's recommendation and given follow-up with neurology. Final Clinical Impressions(s) / ED Diagnoses   Final diagnoses:  Seizure [facility](HCC)    New Prescriptions New Prescriptions   No medications on file     Gwyneth SproutWhitney Chisum Habenicht, MD 03/17/16 2024

## 2016-03-17 NOTE — ED Notes (Signed)
Patient transported to x-Ray

## 2016-03-17 NOTE — ED Triage Notes (Signed)
From home. Family states patient was Sitting at table and fell to floor, having jerking like motion and rigididy for 1 min. States patient was confused after. EMS arrives states patient has dime sized hematoma anterior top of head, patient diaphoretic. Hx 2 years ago, had seizure, came to hospital, no meds given at D/C. Patient postictal for EMS. EMS states patient vomited x1 on scene, EMS gave 4 mg Zofran , 18 g LAC in place. Patient alert and oriented x4 at arrival

## 2016-03-18 ENCOUNTER — Inpatient Hospital Stay: Payer: Self-pay | Admitting: Internal Medicine

## 2017-03-06 ENCOUNTER — Encounter: Payer: Self-pay | Admitting: Family Medicine

## 2017-03-06 ENCOUNTER — Ambulatory Visit: Payer: Self-pay | Attending: Family Medicine | Admitting: Family Medicine

## 2017-03-06 VITALS — BP 131/81 | HR 92 | Temp 98.6°F | Resp 18 | Ht 71.0 in | Wt 157.0 lb

## 2017-03-06 DIAGNOSIS — Z79899 Other long term (current) drug therapy: Secondary | ICD-10-CM | POA: Insufficient documentation

## 2017-03-06 DIAGNOSIS — L720 Epidermal cyst: Secondary | ICD-10-CM | POA: Insufficient documentation

## 2017-03-06 DIAGNOSIS — R569 Unspecified convulsions: Secondary | ICD-10-CM | POA: Insufficient documentation

## 2017-03-06 NOTE — Patient Instructions (Signed)
Epidermal Cyst An epidermal cyst is a small, painless lump under your skin. It may be called an epidermal inclusion cyst or an infundibular cyst. The cyst contains a grayish-white, bad-smelling substance (keratin). It is important not to pop epidermal cysts yourself. These cysts are usually harmless (benign), but they can get infected. Symptoms of infection may include:  Redness.  Inflammation.  Tenderness.  Warmth.  Fever.  A grayish-white, bad-smelling substance draining from the cyst.  Pus draining from the cyst.  Follow these instructions at home:  Take over-the-counter and prescription medicines only as told by your doctor.  If you were prescribed an antibiotic, use it as told by your doctor. Do not stop using the antibiotic even if you start to feel better.  Keep the area around your cyst clean and dry.  Wear loose, dry clothing.  Do not try to pop your cyst.  Avoid touching your cyst.  Check your cyst every day for signs of infection.  Keep all follow-up visits as told by your doctor. This is important. How is this prevented?  Wear clean, dry, clothing.  Avoid wearing tight clothing.  Keep your skin clean and dry. Shower or take baths every day.  Wash your body with a benzoyl peroxide wash when you shower or bathe. Contact a health care provider if:  Your cyst has symptoms of infection.  Your condition is not improving or is getting worse.  You have a cyst that looks different from other cysts you have had.  You have a fever. Get help right away if:  Redness spreads from the cyst into the surrounding area. This information is not intended to replace advice given to you by your health care provider. Make sure you discuss any questions you have with your health care provider. Document Released: 08/28/2004 Document Revised: 03/19/2016 Document Reviewed: 05/23/2015 Elsevier Interactive Patient Education  2018 Elsevier Inc.  

## 2017-03-06 NOTE — Progress Notes (Signed)
   Subjective:  Patient ID: Alex Cox, Alex Cox    DOB: 09/22/1990  Age: 26 y.o. MRN: [id number]  CC: Cyst   HPI Alex Cox presents for complaint of two year history of skin mass to forehead. He reports mass as increased in size 6 months ago. He denies any history of head injury, headaches, visual disturbances, or drainage. History of seizure 03/2016. He denies any episodes of seizures since.       Outpatient Medications Prior to Visit  Medication Sig Dispense Refill  . ibuprofen (ADVIL,MOTRIN) 600 MG tablet Take 1 tablet (600 mg total) by mouth every 6 (six) hours as needed. 30 tablet 0  . naproxen (NAPROSYN) 500 MG tablet Take 1 tablet (500 mg total) by mouth 2 (two) times daily. 30 tablet 0  . levETIRAcetam (KEPPRA) 500 MG tablet Take 1 tablet (500 mg total) by mouth 2 (two) times daily. (Patient not taking: Reported on 03/06/2017) 60 tablet 2   No facility-administered medications prior to visit.     ROS Review of Systems  Constitutional: Negative.   Eyes: Negative.   Respiratory: Negative.   Cardiovascular: Negative.   Gastrointestinal: Negative.   Skin:       Skin mass   Psychiatric/Behavioral: Negative.    Objective:  BP 131/81 (BP Location: Left Arm, Patient Position: Sitting, Cuff Size: Normal)   Pulse 92   Temp 98.6 F (37 C) (Oral)   Resp 18   Ht 5\' 11"  (1.803 m)   Wt 157 lb (71.2 kg)   SpO2 98%   BMI 21.90 kg/m   BP/Weight 03/06/2017 03/17/2016 03/04/2016  Systolic BP 131 137 135  Diastolic BP 81 92 99  Wt. (Lbs) 157 155 -  BMI 21.9 21.02 -     Physical Exam  Constitutional: He is oriented to person, place, and time. He appears well-developed and well-nourished.  HENT:  Head: Normocephalic and atraumatic.    Right Ear: External ear normal.  Left Ear: External ear normal.  Nose: Nose normal.  Mouth/Throat: Oropharynx is clear and moist.  Eyes: Pupils are equal, round, and reactive to light. Conjunctivae are normal.  Neck: No JVD present.   Cardiovascular: Normal rate, regular rhythm, normal heart sounds and intact distal pulses.   Pulmonary/Chest: Effort normal and breath sounds normal.  Abdominal: Soft. Bowel sounds are normal.  Neurological: He is alert and oriented to person, place, and time.  Skin: Skin is warm and dry.  Nursing note and vitals reviewed.    Assessment & Plan:   Problem List Items Addressed This Visit    None    Visit Diagnoses    Epidermal cyst of face    -  Primary   Encouraged to apply for orange card   Relevant Orders   Ambulatory referral to General Surgery       Follow-up: Return As needed.   Lizbeth BarkMandesia R Mylah Baynes FNP

## 2017-04-13 ENCOUNTER — Ambulatory Visit: Payer: Self-pay

## 2017-04-21 ENCOUNTER — Ambulatory Visit: Payer: Self-pay | Attending: Family Medicine

## 2017-05-13 DIAGNOSIS — I10 Essential (primary) hypertension: Secondary | ICD-10-CM | POA: Insufficient documentation

## 2017-05-13 DIAGNOSIS — L720 Epidermal cyst: Secondary | ICD-10-CM | POA: Insufficient documentation

## 2017-05-15 ENCOUNTER — Encounter: Payer: Self-pay | Admitting: Surgery

## 2017-05-15 ENCOUNTER — Ambulatory Visit (INDEPENDENT_AMBULATORY_CARE_PROVIDER_SITE_OTHER): Payer: No Typology Code available for payment source | Admitting: Surgery

## 2017-05-15 VITALS — BP 156/91 | HR 94 | Temp 98.1°F | Ht 71.0 in | Wt 156.0 lb

## 2017-05-15 DIAGNOSIS — R220 Localized swelling, mass and lump, head: Secondary | ICD-10-CM

## 2017-05-15 NOTE — Patient Instructions (Signed)
You have decided to have a cyst/lipoma removal on 06/05/2017 at [facility].

## 2017-05-15 NOTE — Progress Notes (Signed)
Surgical Consultation  05/15/2017  Frederic Tones is an 26 y.o. male.   Referring Physician: Jenelle Mages  CC: Mass on forehead  HPI: This a patient with an enlarging mass on his forehead that has been present for 2 years. It is growing and causing him pain. It has never drained.  Past Medical History:  Diagnosis Date  . Epidermal cyst of face   . Epidermal cyst of face    Forehead  . Hypertension     Past Surgical History:  Procedure Laterality Date  . NO PAST SURGERIES      Family History  Problem Relation Age of Onset  . Hypertension Mother   . Hypertension Father   . Cancer Neg Hx     Social History:  reports that he has been smoking.  He has never used smokeless tobacco. He reports that he drinks alcohol. He reports that he uses drugs, including Marijuana. He works for [employer] Allergies: No Known Allergies  Medications reviewed.   Review of Systems:   Review of Systems  Constitutional: Negative.   HENT: Negative.   Eyes: Negative.   Respiratory: Negative.   Cardiovascular: Negative.   Gastrointestinal: Negative.   Genitourinary: Negative.   Musculoskeletal: Negative.   Skin: Negative.   Neurological: Negative.   Endo/Heme/Allergies: Negative.   Psychiatric/Behavioral: Negative.      Physical Exam:  BP (!) 156/91   Pulse 94   Temp 98.1 F (36.7 C) (Oral)   Ht  (1.803 m)   Wt 156 lb (70.8 kg)   BMI 21.76 kg/m   Physical Exam  Constitutional: He is oriented to person, place, and time and well-developed, well-nourished, and in no distress. No distress.  HENT:  Head: Normocephalic.  2 x 3 cm mass on forehead at hairline. It is on the midline. Likely representing a lipoma or a sebaceous cyst no poor is noted.  Eyes: Pupils are equal, round, and reactive to light. Right eye exhibits no discharge. Left eye exhibits no discharge. No scleral icterus.  Neck: Normal range of motion.  Cardiovascular: Normal rate.   Pulmonary/Chest: Effort normal. No  respiratory distress.  Musculoskeletal: Normal range of motion. He exhibits no edema.  Lymphadenopathy:    He has no cervical adenopathy.  Neurological: He is alert and oriented to person, place, and time.  Skin: Skin is warm and dry. He is not diaphoretic.  Psychiatric: Mood and affect normal.  Vitals reviewed.     No results found for this or any previous visit (from the past 48 hour(s)). No results found.  Assessment/Plan:  Mass on forehead at hairline. Discussed options of observation he wishes to have this removed. I discussed with him the risks of bleeding infection recurrence cosmetic deformity including scar at the hairline. He understood and agreed to proceed  Lattie Haw, MD, FACS

## 2017-05-18 ENCOUNTER — Telehealth: Payer: Self-pay | Admitting: Surgery

## 2017-05-18 NOTE — Telephone Encounter (Signed)
Pt advised of pre op date/time and sx date. Sx: 06/11/17 with Dr Cooper-excision of lipoma/epidermal cyst of forehead.  Pre op: 05/29/17 between 9-1:00pm--phone.   Patient made aware to call [phone], between 1-3:00pm the day before surgery, to find out what time to arrive.

## 2017-05-29 ENCOUNTER — Inpatient Hospital Stay: Admission: RE | Admit: 2017-05-29 | Payer: Self-pay | Source: Ambulatory Visit

## 2017-06-04 ENCOUNTER — Encounter
Admission: RE | Admit: 2017-06-04 | Discharge: 2017-06-04 | Disposition: A | Discharge status: Home / Self Care | Payer: Self-pay | Source: Ambulatory Visit | Attending: Surgery | Admitting: Surgery

## 2017-06-04 NOTE — Patient Instructions (Signed)
Your procedure is scheduled on: 06/11/17 Report to Day Surgery. [facility]OND FLOOR To find out your arrival time please call [phone] between 1PM - 3PM on 06/10/17.  Remember: Instructions that are not followed completely may result in serious medical risk, up to and including death, or upon the discretion of your surgeon and anesthesiologist your surgery may need to be rescheduled.     _X__ 1. Do not eat food after midnight the night before your procedure.                 No gum chewing or hard candies. You may drink clear liquids up to 2 hours                 before you are scheduled to arrive for your surgery- DO not drink clear                 liquids within 2 hours of the start of your surgery.                 Clear Liquids include:  water, apple juice without pulp, clear carbohydrate                 drink such as Clearfast of Gartorade, Black Coffee or Tea (Do not add                 anything to coffee or tea).     _X__ 2.  No Alcohol for 24 hours before or after surgery.   _X__ 3.  Do Not Smoke or use e-cigarettes For 24 Hours Prior to Your Surgery.                 Do not use any chewable tobacco products for at least 6 hours prior to                 surgery.  ____  4.  Bring all medications with you on the day of surgery if instructed.   _X___  5.  Notify your doctor if there is any change in your medical condition      (cold, fever, infections).     Do not wear jewelry, make-up, hairpins, clips or nail polish. Do not wear lotions, powders, or perfumes. You may wear deodorant. Do not shave 48 hours prior to surgery. Men may shave face and neck. Do not bring valuables to the hospital.    [facility] any belongings or valuables.  Contacts, dentures or bridgework may not be worn into surgery. Leave your suitcase in the car. After surgery it may be brought to your room. For patients admitted to the hospital, discharge time  is determined by your treatment team.   Patients discharged the day of surgery will not be allowed to drive home.    ____ Take these medicines the morning of surgery with A SIP OF WATER:    1.NONE  2.   3.   4.  5.  6.  ____ Fleet Enema (as directed)   ____ Use CHG Soap as directed  ____ Use inhalers on the day of surgery  ____ Stop metformin 2 days prior to surgery    ____ Take 1/2 of usual insulin dose the night before surgery. No insulin the morning          of surgery.   ____ Stop Coumadin/Plavix/aspirin on  ____ Stop Anti-inflammatories on ____ Stop supplements until after surgery.    ____ Bring C-Pap  to the hospital.

## 2017-06-11 ENCOUNTER — Encounter: Payer: Self-pay | Admitting: *Deleted

## 2017-06-11 ENCOUNTER — Ambulatory Visit: Payer: Self-pay | Admitting: Anesthesiology

## 2017-06-11 ENCOUNTER — Ambulatory Visit
Admission: RE | Admit: 2017-06-11 | Discharge: 2017-06-11 | Disposition: A | Discharge status: Home / Self Care | Payer: No Typology Code available for payment source | Source: Ambulatory Visit | Attending: Surgery | Admitting: Surgery

## 2017-06-11 ENCOUNTER — Encounter
Admission: RE | Disposition: A | Discharge status: Home / Self Care | Payer: Self-pay | Source: Ambulatory Visit | Attending: Surgery

## 2017-06-11 ENCOUNTER — Other Ambulatory Visit: Payer: Self-pay

## 2017-06-11 DIAGNOSIS — L7211 Pilar cyst: Secondary | ICD-10-CM | POA: Insufficient documentation

## 2017-06-11 DIAGNOSIS — F17200 Nicotine dependence, unspecified, uncomplicated: Secondary | ICD-10-CM | POA: Insufficient documentation

## 2017-06-11 DIAGNOSIS — L729 Follicular cyst of the skin and subcutaneous tissue, unspecified: Secondary | ICD-10-CM

## 2017-06-11 DIAGNOSIS — I10 Essential (primary) hypertension: Secondary | ICD-10-CM | POA: Insufficient documentation

## 2017-06-11 HISTORY — PX: MASS EXCISION: SHX2000

## 2017-06-11 LAB — CBC WITH DIFFERENTIAL/PLATELET
Basophils Absolute: 0 10*3/uL (ref 0–0.1)
Basophils Relative: 1 %
Eosinophils Absolute: 0.1 10*3/uL (ref 0–0.7)
Eosinophils Relative: 1 %
HCT: 45.4 % (ref 40.0–52.0)
Hemoglobin: 14.8 g/dL (ref 13.0–18.0)
LYMPHS ABS: 1.3 10*3/uL (ref 1.0–3.6)
LYMPHS PCT: 33 %
MCH: 31.2 pg (ref 26.0–34.0)
MCHC: 32.5 g/dL (ref 32.0–36.0)
MCV: 96.1 fL (ref 80.0–100.0)
MONO ABS: 0.5 10*3/uL (ref 0.2–1.0)
Monocytes Relative: 13 %
Neutro Abs: 2 10*3/uL (ref 1.4–6.5)
Neutrophils Relative %: 52 %
PLATELETS: 171 10*3/uL (ref 150–440)
RBC: 4.73 MIL/uL (ref 4.40–5.90)
RDW: 14.5 % (ref 11.5–14.5)
WBC: 3.8 10*3/uL (ref 3.8–10.6)

## 2017-06-11 LAB — BASIC METABOLIC PANEL
ANION GAP: 8 (ref 5–15)
BUN: 9 mg/dL (ref 6–20)
CALCIUM: 9.3 mg/dL (ref 8.9–10.3)
CHLORIDE: 103 mmol/L (ref 101–111)
CO2: 25 mmol/L (ref 22–32)
Creatinine, Ser: 1.1 mg/dL (ref 0.61–1.24)
GFR calc Af Amer: >60 mL/min (ref >60)
GFR calc non Af Amer: >60 mL/min (ref >60)
Glucose, Bld: 83 mg/dL (ref 65–99)
Potassium: 3.6 mmol/L (ref 3.5–5.1)
Sodium: 136 mmol/L (ref 135–145)

## 2017-06-11 LAB — URINE DRUG SCREEN, QUALITATIVE (ARMC ONLY)
Amphetamines, Ur Screen: NOT DETECTED
BARBITURATES, UR SCREEN: NOT DETECTED
Benzodiazepine, Ur Scrn: NOT DETECTED
COCAINE METABOLITE, UR ~~LOC~~: NOT DETECTED
Cannabinoid 50 Ng, Ur ~~LOC~~: POSITIVE — AB
MDMA (ECSTASY) UR SCREEN: NOT DETECTED
METHADONE SCREEN, URINE: NOT DETECTED
OPIATE, UR SCREEN: NOT DETECTED
Phencyclidine (PCP) Ur S: NOT DETECTED
Tricyclic, Ur Screen: NOT DETECTED

## 2017-06-11 SURGERY — EXCISION MASS
Anesthesia: General

## 2017-06-11 MED ORDER — ACETAMINOPHEN 10 MG/ML IV SOLN
INTRAVENOUS | Status: DC | PRN
  Administered 2017-06-11: 1000 mg via INTRAVENOUS

## 2017-06-11 MED ORDER — FAMOTIDINE 20 MG PO TABS
ORAL_TABLET | ORAL | Status: AC
  Filled 2017-06-11: qty 1

## 2017-06-11 MED ORDER — BUPIVACAINE HCL (PF) 0.25 % IJ SOLN
INTRAMUSCULAR | Status: AC
  Filled 2017-06-11: qty 30

## 2017-06-11 MED ORDER — LIDOCAINE HCL (CARDIAC) 20 MG/ML IV SOLN
INTRAVENOUS | Status: DC | PRN
  Administered 2017-06-11: 100 mg via INTRAVENOUS

## 2017-06-11 MED ORDER — MIDAZOLAM HCL 2 MG/2ML IJ SOLN
INTRAMUSCULAR | Status: DC | PRN
  Administered 2017-06-11: 2 mg via INTRAVENOUS

## 2017-06-11 MED ORDER — BUPIVACAINE-EPINEPHRINE (PF) 0.25% -1:200000 IJ SOLN
INTRAMUSCULAR | Status: DC | PRN
  Administered 2017-06-11: 13 mL

## 2017-06-11 MED ORDER — LACTATED RINGERS IV SOLN
INTRAVENOUS | Status: DC
  Administered 2017-06-11 (×2): via INTRAVENOUS

## 2017-06-11 MED ORDER — PROMETHAZINE HCL 25 MG/ML IJ SOLN: 6.2500 mg | INTRAMUSCULAR | Status: DC | PRN

## 2017-06-11 MED ORDER — FENTANYL CITRATE (PF) 100 MCG/2ML IJ SOLN
INTRAMUSCULAR | Status: AC
  Filled 2017-06-11: qty 2

## 2017-06-11 MED ORDER — PHENYLEPHRINE HCL 10 MG/ML IJ SOLN
INTRAMUSCULAR | Status: DC | PRN
  Administered 2017-06-11 (×2): 100 ug via INTRAVENOUS

## 2017-06-11 MED ORDER — FAMOTIDINE 20 MG PO TABS
20.0000 mg | ORAL_TABLET | Freq: Once | ORAL | Status: AC
  Administered 2017-06-11: 20 mg via ORAL

## 2017-06-11 MED ORDER — CHLORHEXIDINE GLUCONATE CLOTH 2 % EX PADS: 6.0000 | MEDICATED_PAD | Freq: Once | CUTANEOUS | Status: DC

## 2017-06-11 MED ORDER — FENTANYL CITRATE (PF) 100 MCG/2ML IJ SOLN
INTRAMUSCULAR | Status: DC | PRN
  Administered 2017-06-11 (×2): 50 ug via INTRAVENOUS

## 2017-06-11 MED ORDER — HYDROCODONE-ACETAMINOPHEN 5-300 MG PO TABS: 1.0000 | ORAL_TABLET | Freq: Four times a day (QID) | ORAL | 0 refills | Status: DC | PRN

## 2017-06-11 MED ORDER — FENTANYL CITRATE (PF) 100 MCG/2ML IJ SOLN: 25.0000 ug | INTRAMUSCULAR | Status: DC | PRN

## 2017-06-11 MED ORDER — ONDANSETRON HCL 4 MG/2ML IJ SOLN
INTRAMUSCULAR | Status: DC | PRN
  Administered 2017-06-11: 4 mg via INTRAVENOUS

## 2017-06-11 MED ORDER — PROPOFOL 10 MG/ML IV BOLUS
INTRAVENOUS | Status: AC
  Filled 2017-06-11: qty 20

## 2017-06-11 MED ORDER — LIDOCAINE HCL (PF) 2 % IJ SOLN
INTRAMUSCULAR | Status: AC
  Filled 2017-06-11: qty 10

## 2017-06-11 MED ORDER — MIDAZOLAM HCL 2 MG/2ML IJ SOLN
INTRAMUSCULAR | Status: AC
Start: 2017-06-11 — End: 2017-06-11
  Filled 2017-06-11: qty 2

## 2017-06-11 MED ORDER — PROPOFOL 10 MG/ML IV BOLUS
INTRAVENOUS | Status: DC | PRN
  Administered 2017-06-11: 200 mg via INTRAVENOUS

## 2017-06-11 MED ORDER — ACETAMINOPHEN 10 MG/ML IV SOLN
INTRAVENOUS | Status: AC
  Filled 2017-06-11: qty 100

## 2017-06-11 MED ORDER — DEXAMETHASONE SODIUM PHOSPHATE 10 MG/ML IJ SOLN
INTRAMUSCULAR | Status: DC | PRN
  Administered 2017-06-11: 10 mg via INTRAVENOUS

## 2017-06-11 SURGICAL SUPPLY — 28 items
BLADE SURG 15 STRL LF DISP TIS (BLADE) ×1 IMPLANT
BLADE SURG 15 STRL SS (BLADE) ×1
CHLORAPREP W/TINT 26ML (MISCELLANEOUS) ×2 IMPLANT
DERMABOND ADVANCED (GAUZE/BANDAGES/DRESSINGS) ×1
DERMABOND ADVANCED .7 DNX12 (GAUZE/BANDAGES/DRESSINGS) ×1 IMPLANT
DRAIN PENROSE 1/4X12 LTX (DRAIN) IMPLANT
DRAPE LAPAROTOMY 100X77 ABD (DRAPES) ×2 IMPLANT
ELECT CAUTERY BLADE 6.4 (BLADE) ×2 IMPLANT
ELECT REM PT RETURN 9FT ADLT (ELECTROSURGICAL) ×2
ELECTRODE REM PT RTRN 9FT ADLT (ELECTROSURGICAL) ×1 IMPLANT
GAUZE SPONGE 4X4 12PLY STRL (GAUZE/BANDAGES/DRESSINGS) ×2 IMPLANT
GLOVE BIO SURGEON STRL SZ8 (GLOVE) ×6 IMPLANT
GOWN STRL REUS W/ TWL LRG LVL3 (GOWN DISPOSABLE) ×2 IMPLANT
GOWN STRL REUS W/TWL LRG LVL3 (GOWN DISPOSABLE) ×2
LABEL OR SOLS (LABEL) ×2 IMPLANT
MARGIN MAP 10MM (MISCELLANEOUS) IMPLANT
NEEDLE HYPO 22GX1.5 SAFETY (NEEDLE) ×2 IMPLANT
NS IRRIG 500ML POUR BTL (IV SOLUTION) ×2 IMPLANT
PAD ABD DERMACEA PRESS 5X9 (GAUZE/BANDAGES/DRESSINGS) IMPLANT
SPONGE LAP 18X18 5 PK (GAUZE/BANDAGES/DRESSINGS) ×2 IMPLANT
SUT ETHILON 2 0 FS 18 (SUTURE) IMPLANT
SUT ETHILON 3-0 FS-10 30 BLK (SUTURE) ×2
SUT VIC AB 2-0 CT2 27 (SUTURE) ×4 IMPLANT
SUT VIC AB 3-0 SH 27 (SUTURE) ×1
SUT VIC AB 3-0 SH 27X BRD (SUTURE) ×1 IMPLANT
SUTURE EHLN 3-0 FS-10 30 BLK (SUTURE) ×1 IMPLANT
SYR BULB EAR ULCER 3OZ GRN STR (SYRINGE) ×2 IMPLANT
SYRINGE 10CC LL (SYRINGE) ×2 IMPLANT

## 2017-06-11 NOTE — Discharge Instructions (Addendum)
May shower Return to Dr. Earmon Phoenixooper's clinic in 7 days    AMBULATORY SURGERY  DISCHARGE INSTRUCTIONS   1) The drugs that you were given will stay in your system until tomorrow so for the next 24 hours you should not:  A) Drive an automobile B) Make any legal decisions C) Drink any alcoholic beverage   2) You may resume regular meals tomorrow.  Today it is better to start with liquids and gradually work up to solid foods.  You may eat anything you prefer, but it is better to start with liquids, then soup and crackers, and gradually work up to solid foods.   3) Please notify your doctor immediately if you have any unusual bleeding, trouble breathing, redness and pain at the surgery site, drainage, fever, or pain not relieved by medication.    4) Additional Instructions:        Please contact your physician with any problems or Same Day Surgery at [phone], Monday through Friday 6 am to 4 pm, or [place] [facility] Main number at [phone].

## 2017-06-11 NOTE — Anesthesia Postprocedure Evaluation (Signed)
Anesthesia Post Note  Patient: Alex BridgemanStanley Cox  Procedure(s) Performed: EXCISION OF LIPOMA/EPIDERMAL CYST OF FOREHEAD (N/A )  Patient location during evaluation: PACU Anesthesia Type: General Level of consciousness: awake and alert Pain management: pain level controlled Vital Signs Assessment: post-procedure vital signs reviewed and stable Respiratory status: spontaneous breathing, nonlabored ventilation, respiratory function stable and patient connected to nasal cannula oxygen Cardiovascular status: blood pressure returned to baseline and stable Postop Assessment: no apparent nausea or vomiting Anesthetic complications: no     Last Vitals:  Vitals:   06/11/17 1057 06/11/17 1115  BP: (!) 127/92 137/90  Pulse: 68 68  Resp: 16 16  Temp: 36.7 C   SpO2: 99% 100%    Last Pain:  Vitals:   06/11/17 1115  TempSrc:   PainSc: 0-No pain                 Lenard SimmerAndrew Nakesha Ebrahim

## 2017-06-11 NOTE — Transfer of Care (Signed)
Immediate Anesthesia Transfer of Care Note  Patient: Alex BridgemanStanley Dupin  Procedure(s) Performed: EXCISION OF LIPOMA/EPIDERMAL CYST OF FOREHEAD (N/A )  Patient Location: PACU  Anesthesia Type:General  Level of Consciousness: awake, alert  and oriented  Airway & Oxygen Therapy: Patient Spontanous Breathing and Patient connected to face mask oxygen  Post-op Assessment: Report given to RN and Post -op Vital signs reviewed and stable  Post vital signs: Reviewed and stable  Last Vitals:  Vitals:   06/11/17 0801  BP: (!) 136/98  Pulse: 69  Resp: 16  Temp: 36.8 C  SpO2: 100%    Last Pain:  Vitals:   06/11/17 0801  TempSrc: Oral         Complications: No apparent anesthesia complications

## 2017-06-11 NOTE — Anesthesia Procedure Notes (Signed)
Procedure Name: LMA Insertion Date/Time: 06/11/2017 9:35 AM Performed by: Junious SilkNoles, Quantay Zaremba, CRNA Pre-anesthesia Checklist: Patient identified, Patient being monitored, Timeout performed, Emergency Drugs available and Suction available Patient Re-evaluated:Patient Re-evaluated prior to induction Oxygen Delivery Method: Circle system utilized Preoxygenation: Pre-oxygenation with 100% oxygen Induction Type: IV induction Ventilation: Mask ventilation without difficulty LMA: LMA inserted LMA Size: 4.5 Tube type: Oral Number of attempts: 1 Placement Confirmation: positive ETCO2 and breath sounds checked- equal and bilateral Tube secured with: Tape Dental Injury: Teeth and Oropharynx as per pre-operative assessment

## 2017-06-11 NOTE — Op Note (Signed)
06/11/2017  10:25 AM  PATIENT:  Alex BridgemanStanley Vegh  26 y.o. male  PRE-OPERATIVE DIAGNOSIS: Scalp mass  POST-OPERATIVE DIAGNOSIS:   Scalp mass  PROCEDURE: Excisional biopsy of scalp mass SURGEON:  Lattie Hawichard E Jasmyne Lodato MD, FACS  ANESTHESIA:   General with LMA   Details of Procedure: This patient with a scalp mass requiring excisional biopsy preoperatively discussed rationale for surgery the options of observation risk of bleeding infection recurrence and cosmetic deformity this is all reviewed for him the preop holding area he understood and agreed to proceed.  Findings.  2.3 cm mass excised via a 4-1/2 cm incision closed in an intermediate fashion in layers.  Scription of procedure patient was induced to general anesthesia prepped draped sterile fashion a surgical pause was held and then a lenticular shaped was incision was drawn out.  Local anesthetic was infiltrated in the skin is obtains tissues around the palpable visible mass.  This lenticular shaped incision was then executed with sharp dissection and electrocautery.  The mass was elevated and sent off for examination it was not opened.  Hemostasis was with electrocautery and additional Marcaine was placed for a total of 13 cc.  Once assuring that hemostasis was adequate the wound was closed in layers with 3-0 Vicryl followed by 5-0 Monocryl.  Dermabond was placed.  Patient tolerated the procedure well there were no complications he was taken to recovery room in stable condition to be discharged care of his family in follow-up in 10 days the estimated blood loss was 20 cc.  Sponge lap needle counts correct   Lattie Hawichard E Lylianna Fraiser, MD FACS

## 2017-06-11 NOTE — Anesthesia Preprocedure Evaluation (Addendum)
Anesthesia Evaluation  Patient identified by MRN, date of birth, ID band Patient awake    Reviewed: Allergy & Precautions, H&P , NPO status , Patient's Chart, lab work & pertinent test results, reviewed documented beta blocker date and time   History of Anesthesia Complications Negative for: history of anesthetic complications  Airway Mallampati: I  TM Distance: >3 FB Neck ROM: full    Dental  (+) Missing, Teeth Intact, Dental Advidsory Given   Pulmonary neg pulmonary ROS, Current Smoker,           Cardiovascular Exercise Tolerance: Good hypertension, (-) angina(-) CAD, (-) Past MI, (-) Cardiac Stents and (-) CABG (-) dysrhythmias (-) Valvular Problems/Murmurs     Neuro/Psych Seizures - (Once, never on meds),  negative psych ROS   GI/Hepatic negative GI ROS, Neg liver ROS,   Endo/Other  negative endocrine ROS  Renal/GU negative Renal ROS  negative genitourinary   Musculoskeletal   Abdominal   Peds  Hematology negative hematology ROS (+)   Anesthesia Other Findings Past Medical History: No date: Epidermal cyst of face No date: Epidermal cyst of face     Comment:  Forehead No date: Hypertension     Comment:  NO MEDS YET   Reproductive/Obstetrics negative OB ROS                            Anesthesia Physical Anesthesia Plan  ASA: II  Anesthesia Plan: General   Post-op Pain Management:    Induction: Intravenous  PONV Risk Score and Plan: 1 and Ondansetron and Dexamethasone  Airway Management Planned: LMA  Additional Equipment:   Intra-op Plan:   Post-operative Plan: Extubation in OR  Informed Consent: I have reviewed the patients History and Physical, chart, labs and discussed the procedure including the risks, benefits and alternatives for the proposed anesthesia with the patient or authorized representative who has indicated his/her understanding and acceptance.   Dental  Advisory Given  Plan Discussed with: Anesthesiologist, CRNA and Surgeon  Anesthesia Plan Comments:         Anesthesia Quick Evaluation

## 2017-06-11 NOTE — Progress Notes (Signed)
Preoperative Review   Patient is met in the preoperative holding area. The history is reviewed in the chart and with the patient. I personally reviewed the options and rationale as well as the risks of this procedure that have been previously discussed with the patient. All questions asked by the patient and/or family were answered to their satisfaction.  Patient agrees to proceed with this procedure at this time.  Efe Fazzino E Aubreigh Fuerte M.D. FACS  

## 2017-06-11 NOTE — Anesthesia Post-op Follow-up Note (Signed)
Anesthesia QCDR form completed.        

## 2017-06-12 LAB — SURGICAL PATHOLOGY

## 2017-06-18 ENCOUNTER — Encounter: Payer: No Typology Code available for payment source | Admitting: Surgery

## 2017-07-07 ENCOUNTER — Encounter: Payer: Self-pay | Admitting: Surgery

## 2017-07-07 ENCOUNTER — Telehealth: Payer: Self-pay | Admitting: Surgery

## 2017-07-07 NOTE — Telephone Encounter (Signed)
Left a message for the patient to call the office to reschedule a missed appointment on 06/18/17 with Dr. Excell Seltzerooper, I have also mailed a letter for the patient to give us a call to make this appointment. If the patient calls back please reschedule the patient.

## 2017-08-24 ENCOUNTER — Ambulatory Visit: Payer: Self-pay | Attending: Family Medicine | Admitting: Family Medicine

## 2017-08-24 ENCOUNTER — Encounter: Payer: Self-pay | Admitting: Family Medicine

## 2017-08-24 ENCOUNTER — Ambulatory Visit: Payer: Self-pay | Admitting: Family Medicine

## 2017-08-24 VITALS — BP 121/86 | HR 75 | Temp 97.3°F | Resp 18 | Ht 72.0 in | Wt 166.0 lb

## 2017-08-24 DIAGNOSIS — F17210 Nicotine dependence, cigarettes, uncomplicated: Secondary | ICD-10-CM | POA: Insufficient documentation

## 2017-08-24 DIAGNOSIS — Z23 Encounter for immunization: Secondary | ICD-10-CM

## 2017-08-24 DIAGNOSIS — I10 Essential (primary) hypertension: Secondary | ICD-10-CM

## 2017-08-24 DIAGNOSIS — Z8249 Family history of ischemic heart disease and other diseases of the circulatory system: Secondary | ICD-10-CM | POA: Insufficient documentation

## 2017-08-24 DIAGNOSIS — F17200 Nicotine dependence, unspecified, uncomplicated: Secondary | ICD-10-CM

## 2017-08-24 MED ORDER — HYDROCHLOROTHIAZIDE 12.5 MG PO TABS: 12.5000 mg | ORAL_TABLET | Freq: Every day | ORAL | 2 refills | Status: DC

## 2017-08-24 NOTE — Patient Instructions (Signed)
Check BP prior taking medication.   Managing Your Hypertension Hypertension is commonly called high blood pressure. This is when the force of your blood pressing against the walls of your arteries is too strong. Arteries are blood vessels that carry blood from your heart throughout your body. Hypertension forces the heart to work harder to pump blood, and may cause the arteries to become narrow or stiff. Having untreated or uncontrolled hypertension can cause heart attack, stroke, kidney disease, and other problems. What are blood pressure readings? A blood pressure reading consists of a higher number over a lower number. Ideally, your blood pressure should be below 120/80. The first ("top") number is called the systolic pressure. It is a measure of the pressure in your arteries as your heart beats. The second ("bottom") number is called the diastolic pressure. It is a measure of the pressure in your arteries as the heart relaxes. What does my blood pressure reading mean? Blood pressure is classified into four stages. Based on your blood pressure reading, your health care provider may use the following stages to determine what type of treatment you need, if any. Systolic pressure and diastolic pressure are measured in a unit called mm Hg. Normal  Systolic pressure: below 120.  Diastolic pressure: below 80. Elevated  Systolic pressure: 120-129.  Diastolic pressure: below 80. Hypertension stage 1  Systolic pressure: 130-139.  Diastolic pressure: 80-89. Hypertension stage 2  Systolic pressure: 140 or above.  Diastolic pressure: 90 or above. What health risks are associated with hypertension? Managing your hypertension is an important responsibility. Uncontrolled hypertension can lead to:  A heart attack.  A stroke.  A weakened blood vessel (aneurysm).  Heart failure.  Kidney damage.  Eye damage.  Metabolic syndrome.  Memory and concentration problems.  What changes can I  make to manage my hypertension? Hypertension can be managed by making lifestyle changes and possibly by taking medicines. Your health care provider will help you make a plan to bring your blood pressure within a normal range. Eating and drinking  Eat a diet that is high in fiber and potassium, and low in salt (sodium), added sugar, and fat. An example eating plan is called the DASH (Dietary Approaches to Stop Hypertension) diet. To eat this way: ? Eat plenty of fresh fruits and vegetables. Try to fill half of your plate at each meal with fruits and vegetables. ? Eat whole grains, such as whole wheat pasta, brown rice, or whole grain bread. Fill about one quarter of your plate with whole grains. ? Eat low-fat diary products. ? Avoid fatty cuts of meat, processed or cured meats, and poultry with skin. Fill about one quarter of your plate with lean proteins such as fish, chicken without skin, beans, eggs, and tofu. ? Avoid premade and processed foods. These tend to be higher in sodium, added sugar, and fat.  Reduce your daily sodium intake. Most people with hypertension should eat less than 1,500 mg of sodium a day.  Limit alcohol intake to no more than 1 drink a day for nonpregnant women and 2 drinks a day for men. One drink equals 12 oz of beer, 5 oz of wine, or 1 oz of hard liquor. Lifestyle  Work with your health care provider to maintain a healthy body weight, or to lose weight. Ask what an ideal weight is for you.  Get at least 30 minutes of exercise that causes your heart to beat faster (aerobic exercise) most days of the week. Activities may include  walking, swimming, or biking.  Include exercise to strengthen your muscles (resistance exercise), such as weight lifting, as part of your weekly exercise routine. Try to do these types of exercises for 30 minutes at least 3 days a week.  Do not use any products that contain nicotine or tobacco, such as cigarettes and e-cigarettes. If you need  help quitting, ask your health care provider.  Control any long-term (chronic) conditions you have, such as high cholesterol or diabetes. Monitoring  Monitor your blood pressure at home as told by your health care provider. Your personal target blood pressure may vary depending on your medical conditions, your age, and other factors.  Have your blood pressure checked regularly, as often as told by your health care provider. Working with your health care provider  Review all the medicines you take with your health care provider because there may be side effects or interactions.  Talk with your health care provider about your diet, exercise habits, and other lifestyle factors that may be contributing to hypertension.  Visit your health care provider regularly. Your health care provider can help you create and adjust your plan for managing hypertension. Will I need medicine to control my blood pressure? Your health care provider may prescribe medicine if lifestyle changes are not enough to get your blood pressure under control, and if:  Your systolic blood pressure is 130 or higher.  Your diastolic blood pressure is 80 or higher.  Take medicines only as told by your health care provider. Follow the directions carefully. Blood pressure medicines must be taken as prescribed. The medicine does not work as well when you skip doses. Skipping doses also puts you at risk for problems. Contact a health care provider if:  You think you are having a reaction to medicines you have taken.  You have repeated (recurrent) headaches.  You feel dizzy.  You have swelling in your ankles.  You have trouble with your vision. Get help right away if:  You develop a severe headache or confusion.  You have unusual weakness or numbness, or you feel faint.  You have severe pain in your chest or abdomen.  You vomit repeatedly.  You have trouble breathing. Summary  Hypertension is when the force of  blood pumping through your arteries is too strong. If this condition is not controlled, it may put you at risk for serious complications.  Your personal target blood pressure may vary depending on your medical conditions, your age, and other factors. For most people, a normal blood pressure is less than 120/80.  Hypertension is managed by lifestyle changes, medicines, or both. Lifestyle changes include weight loss, eating a healthy, low-sodium diet, exercising more, and limiting alcohol. This information is not intended to replace advice given to you by your health care provider. Make sure you discuss any questions you have with your health care provider. Document Released: 04/14/2012 Document Revised: 06/18/2016 Document Reviewed: 06/18/2016 Elsevier Interactive Patient Education  2018 [employer].  How to Take Your Blood Pressure You can take your blood pressure at home with a machine. You may need to check your blood pressure at home:  To check if you have high blood pressure (hypertension).  To check your blood pressure over time.  To make sure your blood pressure medicine is working.  Supplies needed: You will need a blood pressure machine, or monitor. You can buy one at a drugstore or online. When choosing one:  Choose one with an arm cuff.  Choose one that  wraps around your upper arm. Only one finger should fit between your arm and the cuff.  Do not choose one that measures your blood pressure from your wrist or finger.  Your doctor can suggest a monitor. How to prepare Avoid these things for 30 minutes before checking your blood pressure:  Drinking caffeine.  Drinking alcohol.  Eating.  Smoking.  Exercising.  Five minutes before checking your blood pressure:  Pee.  Sit in a dining chair. Avoid sitting in a soft couch or armchair.  Be quiet. Do not talk.  How to take your blood pressure Follow the instructions that came with your machine. If you have a  digital blood pressure monitor, these may be the instructions: 1. Sit up straight. 2. Place your feet on the floor. Do not cross your ankles or legs. 3. Rest your left arm at the level of your heart. You may rest it on a table, desk, or chair. 4. Pull up your shirt sleeve. 5. Wrap the blood pressure cuff around the upper part of your left arm. The cuff should be 1 inch (2.5 cm) above your elbow. It is best to wrap the cuff around bare skin. 6. Fit the cuff snugly around your arm. You should be able to place only one finger between the cuff and your arm. 7. Put the cord inside the groove of your elbow. 8. Press the power button. 9. Sit quietly while the cuff fills with air and loses air. 10. Write down the numbers on the screen. 11. Wait 2-3 minutes and then repeat steps 1-10.  What do the numbers mean? Two numbers make up your blood pressure. The first number is called systolic pressure. The second is called diastolic pressure. An example of a blood pressure reading is "120 over 80" (or 120/80). If you are an adult and do not have a medical condition, use this guide to find out if your blood pressure is normal: Normal  First number: below 120.  Second number: below 80. Elevated  First number: 120-129.  Second number: below 80. Hypertension stage 1  First number: 130-139.  Second number: 80-89. Hypertension stage 2  First number: 140 or above.  Second number: 90 or above. Your blood pressure is above normal even if only the top or bottom number is above normal. Follow these instructions at home:  Check your blood pressure as often as your doctor tells you to.  Take your monitor to your next doctor's appointment. Your doctor will: ? Make sure you are using it correctly. ? Make sure it is working right.  Make sure you understand what your blood pressure numbers should be.  Tell your doctor if your medicines are causing side effects. Contact a doctor if:  Your blood  pressure keeps being high. Get help right away if:  Your first blood pressure number is higher than 180.  Your second blood pressure number is higher than 120. This information is not intended to replace advice given to you by your health care provider. Make sure you discuss any questions you have with your health care provider. Document Released: 07/03/2008 Document Revised: 06/18/2016 Document Reviewed: 12/28/2015 Elsevier Interactive Patient Education  [employer]2018 [employer]

## 2017-08-24 NOTE — Progress Notes (Signed)
   Subjective:  Patient ID: Alex Cox, male    DOB: 07/10/1991  Age: 27 y.o. MRN: [id number]  CC: Hypertension   HPI Alex Cox presents for hypertension. History of HTN :  He is not exercising and is not adherent to low salt diet.  Blood pressure is not well controlled at home. He reports ranges in the 140's SBP and DBP 100's. Cardiac symptoms none. Patient denies chest pain, chest pressure/discomfort, claudication, dyspnea, lower extremity edema, near-syncope, palpitations and syncope.  Cardiovascular risk factors: hypertension, male gender, sedentary lifestyle and smoking/ tobacco exposure. He reports 15 year smoking history and reports smoking 1/2 pack per day. He is not ready to quit at this time. Use of agents associated with hypertension: none. History of target organ damage: none. Family history of HTN mother.      Outpatient Medications Prior to Visit  Medication Sig Dispense Refill  . Hydrocodone-Acetaminophen (VICODIN) 5-300 MG TABS Take 1 tablet every 6 (six) hours as needed by mouth (pain). 10 each 0   No facility-administered medications prior to visit.     ROS Review of Systems  Constitutional: Negative.   Eyes: Negative.   Respiratory: Negative.   Cardiovascular: Negative.   Skin: Negative.   Neurological: Negative.     Objective:  BP 121/86 (BP Location: Left Arm, Patient Position: Sitting, Cuff Size: Normal)   Pulse 75   Temp (!) 97.3 F (36.3 C) (Oral)   Resp 18   Ht 6' (1.829 m)   Wt 166 lb (75.3 kg)   SpO2 100%   BMI 22.51 kg/m   BP/Weight 08/24/2017 06/11/2017 05/15/2017  Systolic BP 121 137 156  Diastolic BP 86 90 91  Wt. (Lbs) 166 156 156  BMI 22.51 21.76 21.76     Physical Exam  Constitutional: He appears well-developed and well-nourished.  Eyes: Conjunctivae are normal. Pupils are equal, round, and reactive to light.  Neck: No JVD present.  Cardiovascular: Normal rate, regular rhythm, normal heart sounds and intact distal  pulses.  Pulmonary/Chest: Effort normal and breath sounds normal.  Abdominal: Soft. Bowel sounds are normal. There is no tenderness.  Neurological: He is alert.  Skin: Skin is warm and dry.  Psychiatric: He has a normal mood and affect.  Nursing note and vitals reviewed.   Assessment & Plan:   1. Essential hypertension Keep log of BP and bring to next office visit. Check BP prior to taking BP medications Schedule BP recheck in 2 weeks with nurse. If BP is greater than 90/60 (MAP 65 or greater) but not less than 130/80 may increase dose of HCTZ to 25 mg QD and recheck in another 2 weeks.  - hydrochlorothiazide (HYDRODIURIL) 12.5 MG tablet; Take 1 tablet (12.5 mg total) by mouth daily.  Dispense: 30 tablet; Refill: 2  2. Current smoker Smoking cessation discussed he is not ready to quit at this time.  3. Flu vaccine need  - Flu Vaccine QUAD 6+ mos PF IM (Fluarix Quad PF)      Follow-up: Return in about 2 weeks (around 09/07/2017) for BP check with Travia.   Lizbeth BarkMandesia R Blake Vetrano FNP

## 2017-09-08 ENCOUNTER — Ambulatory Visit: Payer: Medicaid Other | Admitting: Pharmacist

## 2017-09-08 NOTE — Progress Notes (Deleted)
   S:    Patient arrives ***.    Presents to the clinic for hypertension evaluation.   Patient {Actions; denies-reports:120008} adherence with medications.  Current BP Medications include:  HCTZ 12.5 mg daily    O:   Last 3 Office BP readings: BP Readings from Last 3 Encounters:  08/24/17 121/86  06/11/17 137/90  05/15/17 (!) 156/91    BMET    Component Value Date/Time   NA 136 06/11/2017 0812   K 3.6 06/11/2017 0812   CL 103 06/11/2017 0812   CO2 25 06/11/2017 0812   GLUCOSE 83 06/11/2017 0812   BUN 9 06/11/2017 0812   CREATININE 1.10 06/11/2017 0812   CALCIUM 9.3 06/11/2017 0812   GFRNONAA >60 06/11/2017 0812   GFRAA >60 06/11/2017 [id number]    A/P: Hypertension longstanding/newly diagnosed currently *** on current medications.  {Meds adjust:18428} ***.   Results reviewed and written information provided.   Total time in face-to-face counseling *** minutes.   F/U Clinic Visit with Dr. Marland Kitchen***.

## 2017-09-10 MED FILL — ?HYDROCHLOROTHIAZIDE 12.5MG: 12.5 | 30 days supply | Qty: 30 | Fill #0

## 2018-01-18 IMAGING — CR DG KNEE COMPLETE 4+V*R*
4 series · 4 of 4 positions shown · non-contrast
Comparison: None.

CLINICAL DATA: Basketball injury yesterday. Medial right knee pain
and swelling.

EXAM:
RIGHT KNEE - COMPLETE 4+ VIEW

[knee ap]
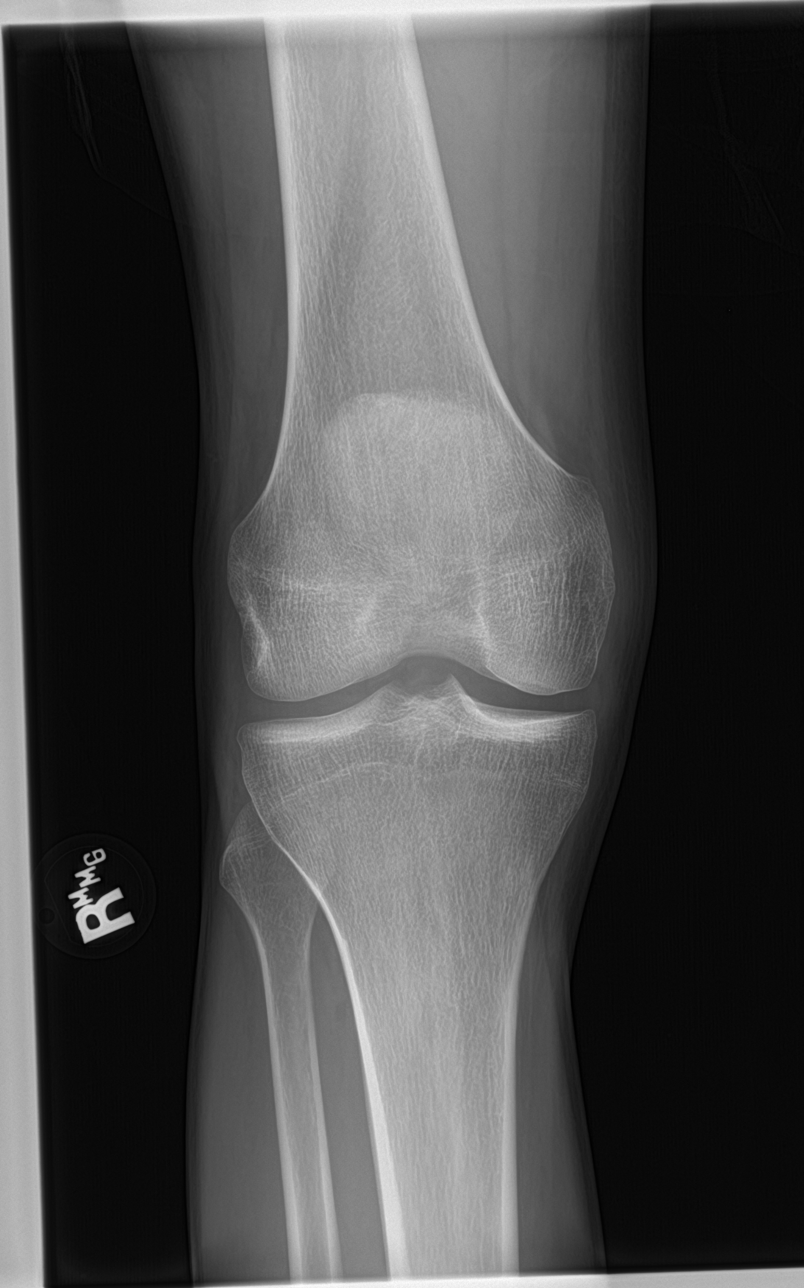

[tunnel]
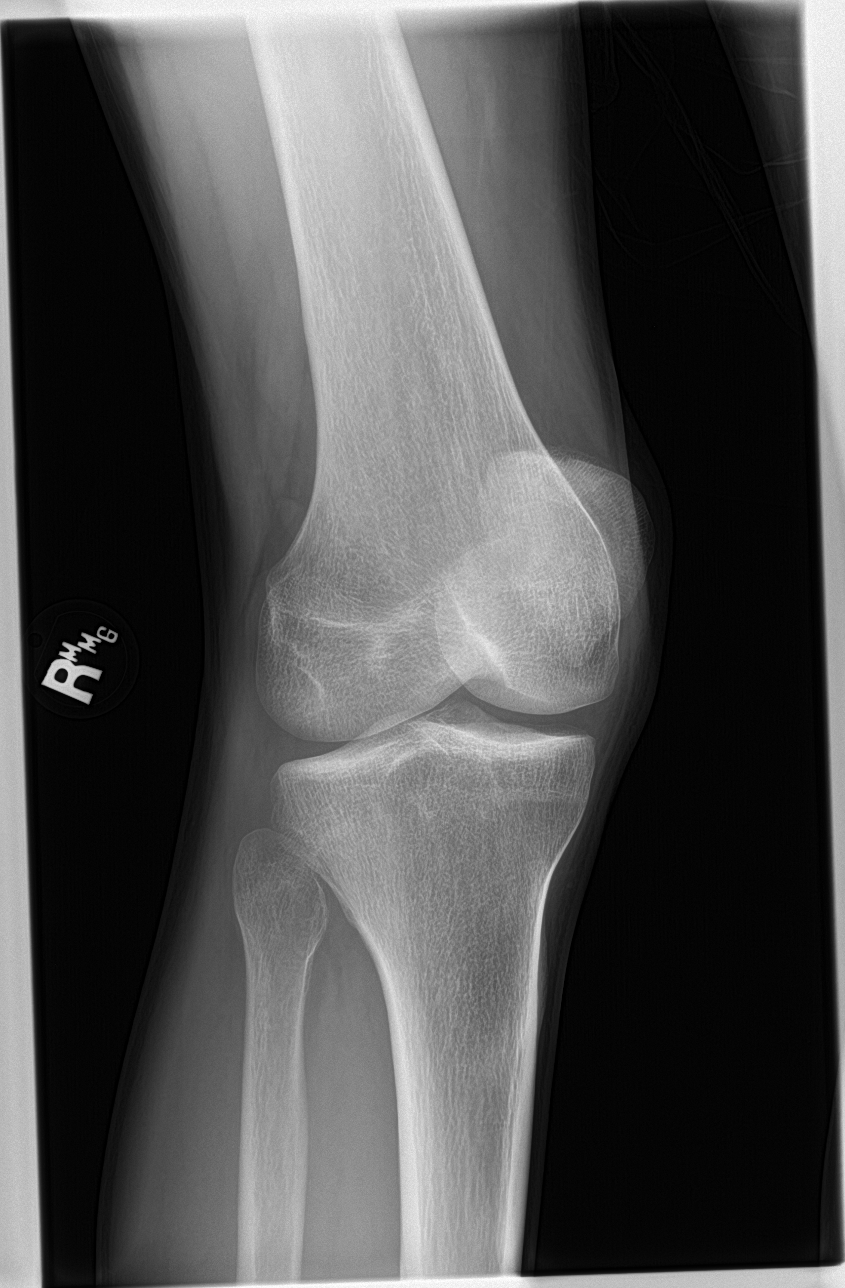

[knee lat]
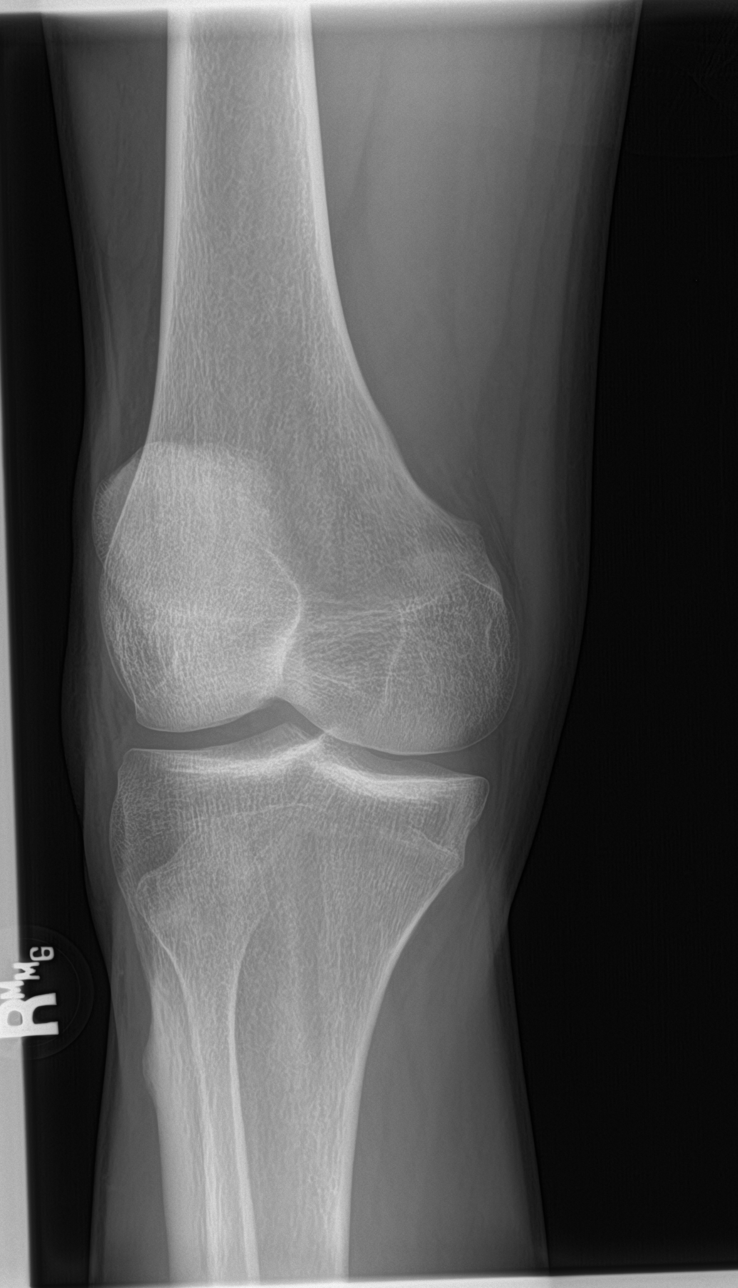

[knee sunrise]
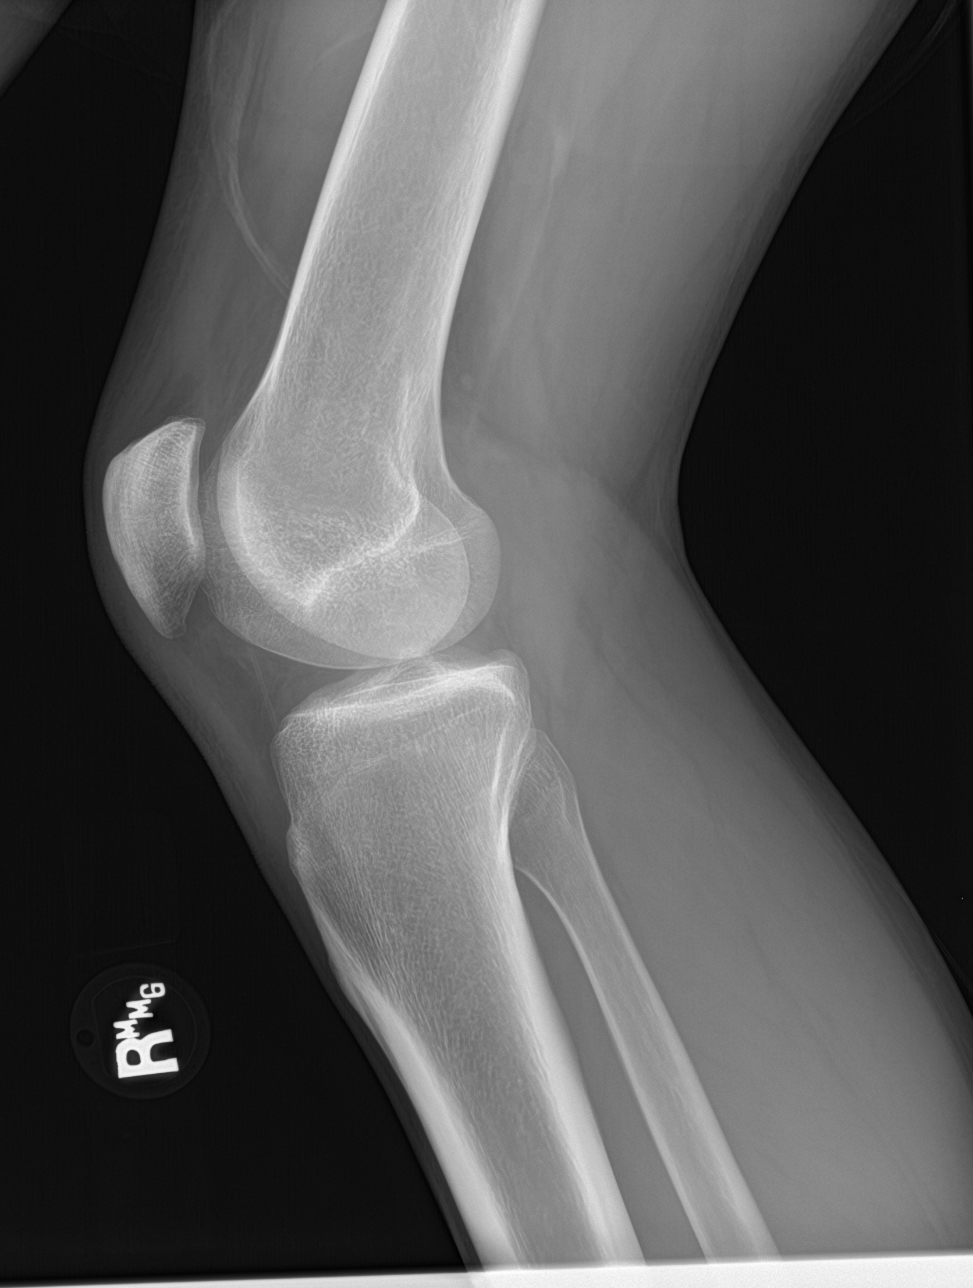

[4 of 4 positions shown; findings below may reference images not displayed]

FINDINGS: There is no evidence of fracture, dislocation, or joint effusion.
There is no evidence of arthropathy or other focal bone abnormality.
Soft tissues are unremarkable.
IMPRESSION: Negative.

## 2018-02-15 MED FILL — HYDROCHLOROTHIAZIDE 12.5 MG: 12.5 | 30 days supply | Qty: 30 | Fill #1

## 2018-02-26 ENCOUNTER — Ambulatory Visit: Payer: Medicaid Other

## 2018-03-04 ENCOUNTER — Ambulatory Visit: Payer: Self-pay | Attending: Family Medicine

## 2018-04-07 ENCOUNTER — Ambulatory Visit: Payer: Medicaid Other | Admitting: Nurse Practitioner

## 2018-04-26 MED FILL — HYDROCHLOROTHIAZIDE 12.5 MG: 12.5 | 30 days supply | Qty: 30 | Fill #2

## 2018-05-12 ENCOUNTER — Ambulatory Visit: Payer: Self-pay | Attending: Family Medicine | Admitting: Family Medicine

## 2018-05-12 ENCOUNTER — Encounter: Payer: Self-pay | Admitting: Family Medicine

## 2018-05-12 VITALS — BP 116/82 | HR 88 | Temp 98.4°F | Resp 18 | Ht 72.0 in | Wt 156.0 lb

## 2018-05-12 DIAGNOSIS — F17210 Nicotine dependence, cigarettes, uncomplicated: Secondary | ICD-10-CM | POA: Insufficient documentation

## 2018-05-12 DIAGNOSIS — Z79899 Other long term (current) drug therapy: Secondary | ICD-10-CM | POA: Insufficient documentation

## 2018-05-12 DIAGNOSIS — Z8249 Family history of ischemic heart disease and other diseases of the circulatory system: Secondary | ICD-10-CM | POA: Insufficient documentation

## 2018-05-12 DIAGNOSIS — I10 Essential (primary) hypertension: Secondary | ICD-10-CM

## 2018-05-12 DIAGNOSIS — F1290 Cannabis use, unspecified, uncomplicated: Secondary | ICD-10-CM | POA: Insufficient documentation

## 2018-05-12 DIAGNOSIS — Z7289 Other problems related to lifestyle: Secondary | ICD-10-CM | POA: Insufficient documentation

## 2018-05-12 DIAGNOSIS — Z23 Encounter for immunization: Secondary | ICD-10-CM

## 2018-05-12 MED ORDER — HYDROCHLOROTHIAZIDE 12.5 MG PO TABS: 12.5000 mg | ORAL_TABLET | Freq: Every day | ORAL | 6 refills | Status: DC

## 2018-05-12 NOTE — Progress Notes (Signed)
Subjective:    Patient ID: Alex Cox, male    DOB: 08/20/1990, 27 y.o.   MRN: [id number]  HPI 27 year old male who was last seen here in the office on 03/06/2017 who presents secondary to issues with his blood pressure being elevated when he goes to donate plasma at [facility].  Patient has a form with him with blood pressure and pulse readings taken at recent visits at the plasma donation center.  Patient systolic blood pressure has ranged from 128-163 with a diastolic blood pressure ranging from 89-109 and patient's heart rate has been elevated between 101-137 with readings that were done from May 05, 2018 through May 10, 2018.  Patient reports that he is taking hydrochlorothiazide on a daily basis.  Patient was started on this medication at his last visit here in the office on 08/24/2017. Patient reports that when he has his BP checked at this local pharmacy, his readings are normal. Patient denies any headaches or dizziness related to his blood pressure. No sensation of chest pain or palpitations. He thinks that maybe he gets nervous, anxious prior to donating plasma. Patient reports that he has been taking his medication.       Patient reports no known allergies.  Patient reports no significant past medical history other than his diagnosis of hypertension.  Patient reports a family history of his mother and father having hypertension.  There is no family history of CVA, heart disease, cancer or diabetes.  Patient admits that he does smoke about 7 cigarettes a day which is a decrease from about half a pack daily last year.  Patient states that he drinks 1 pint to 1/5 of alcohol about once per week and patient additionally states that he smokes marijuana on the weekends.  Patient reports that his only surgery has been removal of a nodule from the middle top of the forehead/scalp area last year.  Patient reports that he has been under some increased stress as he states that he recently quit his job  because of the distance from where he lives.  Patient states that he is looking for another job.  Patient does not believe that he is depressed.  Patient states that he likely does have some issues with anxiety but he does not believe that he needs any medication or counseling at this time.  Past Medical History:  Diagnosis Date  . Epidermal cyst of face   . Epidermal cyst of face    Forehead  . Hypertension    HCTZ was prescribed on 08/24/17   Past Surgical History:  Procedure Laterality Date  . MASS EXCISION N/A 06/11/2017   Procedure: EXCISION OF LIPOMA/EPIDERMAL CYST OF FOREHEAD;  Surgeon: Lattie Haw, MD;  Location: [facility] ORS;  Service: General;  Laterality: N/A;   Family History  Problem Relation Age of Onset  . Hypertension Mother   . Hypertension Father   . Cancer Neg Hx    Social History   Tobacco Use  . Smoking status: Current Every Day Smoker    Packs/day: 0.50    Types: Cigarettes  . Smokeless tobacco: Never Used  Substance Use Topics  . Alcohol use: Yes  . Drug use: Yes    Types: Marijuana  .No Known Allergies   Review of Systems  Constitutional: Negative for chills, diaphoresis, fatigue and fever.  Respiratory: Negative for cough and shortness of breath.   Cardiovascular: Negative for chest pain, palpitations and leg swelling.  Gastrointestinal: Negative for abdominal pain and nausea.  Genitourinary:  Negative for dysuria, flank pain and frequency.  Musculoskeletal: Negative for arthralgias, back pain, gait problem and joint swelling.  Neurological: Negative for dizziness, light-headedness and headaches.  Psychiatric/Behavioral: Negative for suicidal ideas. The patient is nervous/anxious.        Objective:   Physical Exam BP 116/82 (BP Location: Left Arm, Cuff Size: Normal)   Pulse 88   Temp 98.4 F (36.9 C) (Oral)   Resp 18   Ht 6' (1.829 m)   Wt 156 lb (70.8 kg)   SpO2 99%   BMI 21.16 kg/m   Vital signs and nurse's notes reviewed.  Patient's  blood pressure was checked twice by machine as well as checked manually and all 3 blood pressures were within normal General-well-nourished, well-developed young adult male in no acute distress ENT-TMs gray, normal intranasal exam, normal oropharynx Neck-supple, no lymphadenopathy, no thyromegaly, no carotid bruit Lungs-clear to auscultation bilaterally Cardiovascular-regular rate and rhythm.  Patient did have an increase in heart rate during the examination.  Patient stated that he does tend to feel nervous when he comes to the doctor's office Back-no CVA tenderness Abdomen-soft, nontender Extremities-no edema Psych- normal mood and judgment      Assessment & Plan:  1. Essential hypertension Patient's blood pressure was rechecked 3 times at today's visit and blood pressure was within normal.  Patient is provided with refill of hydrochlorothiazide to take once daily and information on a DASH diet as well as information on hypertension was given to the patient as part of his after visit summary (AVS).  Patient was encouraged to stop smoking as well as stopping his use of alcohol and marijuana use.  Patient was asked to have a BMP to make sure that he does not have any issues with low potassium or elevated creatinine related to use of HCTZ.  Patient had to leave soon after his visit but promised to return this afternoon to have blood work as well as receive his influenza immunization.  Form was completed for patient from [facility] plasma services indicating the patient was seen at today's visit and his blood pressure was normal.  Also indicated the patient is currently on medication for his blood pressure.  Copy of this form will be scanned into the patient's chart.  Patient is encouraged to continue to have his blood pressure checked periodically and return to clinic if his blood pressure is staying greater than 130/80. - hydrochlorothiazide (HYDRODIURIL) 12.5 MG tablet; Take 1 tablet (12.5 mg total) by  mouth daily. - Basic Metabolic Panel; Future   2. Need for immunization against influenza Patient was offered and agreed to receive influenza immunization.  Patient was given informational handout regarding this immunization. - Flu Vaccine QUAD 36+ mos IM  An After Visit Summary was printed and given to the patient.  Return in about 6 months (around 11/11/2018) for return today for lab/flu shot.

## 2018-05-12 NOTE — Patient Instructions (Signed)

## 2018-05-13 LAB — BASIC METABOLIC PANEL WITH GFR
BUN/Creatinine Ratio: 6 — ABNORMAL LOW (ref 9–20)
BUN: 8 mg/dL (ref 6–20)
CO2: 24 mmol/L (ref 20–29)
Calcium: 9.3 mg/dL (ref 8.7–10.2)
Chloride: 103 mmol/L (ref 96–106)
Creatinine, Ser: 1.28 mg/dL — ABNORMAL HIGH (ref 0.76–1.27)
GFR calc Af Amer: 87 mL/min/1.73
GFR calc non Af Amer: 75 mL/min/1.73
Glucose: 77 mg/dL (ref 65–99)
Potassium: 3.7 mmol/L (ref 3.5–5.2)
Sodium: 142 mmol/L (ref 134–144)

## 2018-05-14 ENCOUNTER — Telehealth: Payer: Self-pay | Admitting: *Deleted

## 2018-05-14 NOTE — Telephone Encounter (Signed)
Patient verified DOB Patient is aware of needing to adhere to BP medication, drinking plenty of water daily and eliminating salt to address the slightly elevated creatinine level. No further questions.

## 2018-05-14 NOTE — Telephone Encounter (Signed)
-----   Message from Cain Saupe, MD sent at 05/14/2018  2:23 PM EDT ----- Please notify patient that his creatinine is just above normal at 1.28 (normal 0.76-1.27). He should take his blood pressure medication daily and follow a DASH diet as per the AVS information from his recent handout and remain hydrated with water daily

## 2018-05-24 MED FILL — HYDROCHLOROTHIAZIDE 12.5 MG: 12.5 | 30 days supply | Qty: 30 | Fill #0

## 2018-06-16 MED FILL — HYDROCHLOROTHIAZIDE 12.5 MG: 12.5 | 30 days supply | Qty: 30 | Fill #1

## 2018-07-14 MED FILL — HYDROCHLOROTHIAZIDE 12.5 MG: 12.5 | 30 days supply | Qty: 30 | Fill #2

## 2018-08-26 MED FILL — HYDROCHLOROTHIAZIDE 12.5 MG: 12.5 | 30 days supply | Qty: 30 | Fill #3

## 2018-09-29 MED FILL — HYDROCHLOROTHIAZIDE 12.5 MG: 12.5 | 30 days supply | Qty: 30 | Fill #4

## 2018-10-26 MED FILL — HYDROCHLOROTHIAZIDE 12.5 MG: 12.5 | 30 days supply | Qty: 30 | Fill #5

## 2018-12-15 MED FILL — HYDROCHLOROTHIAZIDE 12.5 MG: 12.5 | 30 days supply | Qty: 30 | Fill #6

## 2019-02-03 ENCOUNTER — Telehealth: Payer: Self-pay | Admitting: Family Medicine

## 2019-02-03 DIAGNOSIS — I10 Essential (primary) hypertension: Secondary | ICD-10-CM

## 2019-02-03 MED ORDER — HYDROCHLOROTHIAZIDE 12.5 MG PO TABS: 12.5000 mg | ORAL_TABLET | Freq: Every day | ORAL | 0 refills | Status: AC

## 2019-02-03 MED FILL — HYDROCHLOROTHIAZIDE 12.5 MG: 12.5 | 8 days supply | Qty: 8 | Fill #0

## 2019-02-03 NOTE — Telephone Encounter (Signed)
1) Medication(s) Requested (by name): hydrochlorothiazide (HYDRODIURIL) 12.5 MG tablet [[id number]]  ( pt has upcoming appt)   2) Pharmacy of Choice: [facility], [place]   3) Special Requests:   Approved medications will be sent to the pharmacy, we will reach out if there is an issue.  Requests made after 3pm may not be addressed until the following business day!  If a patient is unsure of the name of the medication(s) please note and ask patient to call back when they are able to provide all info, do not send to responsible party until all information is available!

## 2019-02-11 ENCOUNTER — Ambulatory Visit: Payer: Medicaid Other | Admitting: Family Medicine

## 2020-06-12 ENCOUNTER — Inpatient Hospital Stay (HOSPITAL_COMMUNITY): Payer: Medicaid Other | Admitting: Certified Registered Nurse Anesthetist

## 2020-06-12 ENCOUNTER — Inpatient Hospital Stay (HOSPITAL_COMMUNITY)
Admission: EM | Admit: 2020-06-12 | Discharge: 2020-06-16 | DRG: 956 | Disposition: A | Discharge status: Home Health | Payer: Medicaid Other | Attending: Orthopedic Surgery | Admitting: Orthopedic Surgery

## 2020-06-12 ENCOUNTER — Encounter (HOSPITAL_COMMUNITY): Payer: Self-pay

## 2020-06-12 ENCOUNTER — Emergency Department (HOSPITAL_COMMUNITY): Payer: Medicaid Other

## 2020-06-12 ENCOUNTER — Encounter (HOSPITAL_COMMUNITY): Admission: EM | Disposition: A | Discharge status: Home Health | Payer: Self-pay | Source: Home / Self Care

## 2020-06-12 ENCOUNTER — Inpatient Hospital Stay (HOSPITAL_COMMUNITY): Payer: Medicaid Other

## 2020-06-12 ENCOUNTER — Other Ambulatory Visit: Payer: Self-pay

## 2020-06-12 DIAGNOSIS — S72001A Fracture of unspecified part of neck of right femur, initial encounter for closed fracture: Secondary | ICD-10-CM

## 2020-06-12 DIAGNOSIS — S51012A Laceration without foreign body of left elbow, initial encounter: Secondary | ICD-10-CM | POA: Diagnosis present

## 2020-06-12 DIAGNOSIS — F17210 Nicotine dependence, cigarettes, uncomplicated: Secondary | ICD-10-CM | POA: Diagnosis present

## 2020-06-12 DIAGNOSIS — T07XXXA Unspecified multiple injuries, initial encounter: Secondary | ICD-10-CM

## 2020-06-12 DIAGNOSIS — S32491A Other specified fracture of right acetabulum, initial encounter for closed fracture: Secondary | ICD-10-CM | POA: Diagnosis present

## 2020-06-12 DIAGNOSIS — Y92410 Unspecified street and highway as the place of occurrence of the external cause: Secondary | ICD-10-CM

## 2020-06-12 DIAGNOSIS — S32401A Unspecified fracture of right acetabulum, initial encounter for closed fracture: Secondary | ICD-10-CM

## 2020-06-12 DIAGNOSIS — I959 Hypotension, unspecified: Secondary | ICD-10-CM | POA: Diagnosis present

## 2020-06-12 DIAGNOSIS — Z20822 Contact with and (suspected) exposure to covid-19: Secondary | ICD-10-CM | POA: Diagnosis present

## 2020-06-12 DIAGNOSIS — D62 Acute posthemorrhagic anemia: Secondary | ICD-10-CM | POA: Diagnosis not present

## 2020-06-12 DIAGNOSIS — S27321A Contusion of lung, unilateral, initial encounter: Secondary | ICD-10-CM

## 2020-06-12 DIAGNOSIS — Z23 Encounter for immunization: Secondary | ICD-10-CM

## 2020-06-12 DIAGNOSIS — S7221XA Displaced subtrochanteric fracture of right femur, initial encounter for closed fracture: Principal | ICD-10-CM | POA: Diagnosis present

## 2020-06-12 DIAGNOSIS — S7291XA Unspecified fracture of right femur, initial encounter for closed fracture: Secondary | ICD-10-CM | POA: Diagnosis present

## 2020-06-12 DIAGNOSIS — S270XXA Traumatic pneumothorax, initial encounter: Secondary | ICD-10-CM | POA: Diagnosis present

## 2020-06-12 DIAGNOSIS — N179 Acute kidney failure, unspecified: Secondary | ICD-10-CM | POA: Diagnosis present

## 2020-06-12 DIAGNOSIS — Z419 Encounter for procedure for purposes other than remedying health state, unspecified: Secondary | ICD-10-CM

## 2020-06-12 DIAGNOSIS — G40909 Epilepsy, unspecified, not intractable, without status epilepticus: Secondary | ICD-10-CM | POA: Diagnosis present

## 2020-06-12 DIAGNOSIS — I10 Essential (primary) hypertension: Secondary | ICD-10-CM | POA: Diagnosis present

## 2020-06-12 DIAGNOSIS — M79651 Pain in right thigh: Secondary | ICD-10-CM | POA: Diagnosis present

## 2020-06-12 HISTORY — PX: FEMUR IM NAIL: SHX1597

## 2020-06-12 LAB — COMPREHENSIVE METABOLIC PANEL
ALT: 110 U/L — ABNORMAL HIGH (ref 0–44)
AST: 245 U/L — ABNORMAL HIGH (ref 15–41)
Albumin: 4.1 g/dL (ref 3.5–5.0)
Alkaline Phosphatase: 54 U/L (ref 38–126)
Anion gap: 16 — ABNORMAL HIGH (ref 5–15)
BUN: 10 mg/dL (ref 6–20)
CO2: 22 mmol/L (ref 22–32)
Calcium: 9.4 mg/dL (ref 8.9–10.3)
Chloride: 101 mmol/L (ref 98–111)
Creatinine, Ser: 1.45 mg/dL — ABNORMAL HIGH (ref 0.61–1.24)
GFR, Estimated: >60 mL/min (ref >60)
Glucose, Bld: 108 mg/dL — ABNORMAL HIGH (ref 70–99)
Potassium: 3.1 mmol/L — ABNORMAL LOW (ref 3.5–5.1)
Sodium: 139 mmol/L (ref 135–145)
Total Bilirubin: 0.9 mg/dL (ref 0.3–1.2)
Total Protein: 7 g/dL (ref 6.5–8.1)

## 2020-06-12 LAB — URINALYSIS, ROUTINE W REFLEX MICROSCOPIC
Bilirubin Urine: NEGATIVE
Glucose, UA: NEGATIVE mg/dL
Ketones, ur: 5 mg/dL — AB
Leukocytes,Ua: NEGATIVE
Nitrite: NEGATIVE
Protein, ur: 30 mg/dL — AB
RBC / HPF: >50 RBC/hpf — ABNORMAL HIGH (ref 0–5)
Specific Gravity, Urine: >1.046 — ABNORMAL HIGH (ref 1.005–1.030)
pH: 5 (ref 5.0–8.0)

## 2020-06-12 LAB — SAMPLE TO BLOOD BANK

## 2020-06-12 LAB — CBC
HCT: 43.3 % (ref 39.0–52.0)
Hemoglobin: 14.5 g/dL (ref 13.0–17.0)
MCH: 31.6 pg (ref 26.0–34.0)
MCHC: 33.5 g/dL (ref 30.0–36.0)
MCV: 94.3 fL (ref 80.0–100.0)
Platelets: 216 10*3/uL (ref 150–400)
RBC: 4.59 MIL/uL (ref 4.22–5.81)
RDW: 13.6 % (ref 11.5–15.5)
WBC: 7.1 10*3/uL (ref 4.0–10.5)
nRBC: 0 % (ref 0.0–0.2)

## 2020-06-12 LAB — I-STAT CHEM 8, ED
BUN: 12 mg/dL (ref 6–20)
Calcium, Ion: 1.13 mmol/L — ABNORMAL LOW (ref 1.15–1.40)
Chloride: 100 mmol/L (ref 98–111)
Creatinine, Ser: 1.7 mg/dL — ABNORMAL HIGH (ref 0.61–1.24)
Glucose, Bld: 95 mg/dL (ref 70–99)
HCT: 45 % (ref 39.0–52.0)
Hemoglobin: 15.3 g/dL (ref 13.0–17.0)
Potassium: 3.4 mmol/L — ABNORMAL LOW (ref 3.5–5.1)
Sodium: 139 mmol/L (ref 135–145)
TCO2: 23 mmol/L (ref 22–32)

## 2020-06-12 LAB — ETHANOL: Alcohol, Ethyl (B): 138 mg/dL — ABNORMAL HIGH (ref <10)

## 2020-06-12 LAB — RESPIRATORY PANEL BY RT PCR (FLU A&B, COVID)
Influenza A by PCR: NEGATIVE
Influenza B by PCR: NEGATIVE
SARS Coronavirus 2 by RT PCR: NEGATIVE

## 2020-06-12 LAB — PROTIME-INR
INR: 1.1 (ref 0.8–1.2)
Prothrombin Time: 13.8 seconds (ref 11.4–15.2)

## 2020-06-12 LAB — ABO/RH: ABO/RH(D): A POS

## 2020-06-12 SURGERY — INSERTION, INTRAMEDULLARY ROD, FEMUR
Anesthesia: General | Laterality: Right

## 2020-06-12 MED ORDER — DOCUSATE SODIUM 100 MG PO CAPS
100.0000 mg | ORAL_CAPSULE | Freq: Two times a day (BID) | ORAL | Status: DC
  Administered 2020-06-13 – 2020-06-16 (×3): 100 mg via ORAL
  Filled 2020-06-12 (×5): qty 1

## 2020-06-12 MED ORDER — ONDANSETRON HCL 4 MG/2ML IJ SOLN
INTRAMUSCULAR | Status: DC | PRN
  Administered 2020-06-12: 4 mg via INTRAVENOUS

## 2020-06-12 MED ORDER — MIDAZOLAM HCL 2 MG/2ML IJ SOLN
INTRAMUSCULAR | Status: AC
  Filled 2020-06-12: qty 2

## 2020-06-12 MED ORDER — MORPHINE SULFATE (PF) 2 MG/ML IV SOLN
2.0000 mg | INTRAVENOUS | Status: DC | PRN
  Administered 2020-06-12 (×2): 4 mg via INTRAVENOUS
  Filled 2020-06-12 (×2): qty 2

## 2020-06-12 MED ORDER — FENTANYL CITRATE (PF) 250 MCG/5ML IJ SOLN
INTRAMUSCULAR | Status: AC
  Filled 2020-06-12: qty 5

## 2020-06-12 MED ORDER — AMISULPRIDE (ANTIEMETIC) 5 MG/2ML IV SOLN: 10.0000 mg | Freq: Once | INTRAVENOUS | Status: DC | PRN

## 2020-06-12 MED ORDER — PROPOFOL 10 MG/ML IV BOLUS
INTRAVENOUS | Status: DC | PRN
  Administered 2020-06-12: 120 mg via INTRAVENOUS

## 2020-06-12 MED ORDER — ACETAMINOPHEN 10 MG/ML IV SOLN: 1000.0000 mg | Freq: Once | INTRAVENOUS | Status: DC | PRN

## 2020-06-12 MED ORDER — OXYCODONE HCL 5 MG PO TABS
5.0000 mg | ORAL_TABLET | ORAL | Status: DC | PRN
  Administered 2020-06-12 – 2020-06-14 (×7): 10 mg via ORAL
  Filled 2020-06-12 (×7): qty 2
  Filled 2020-06-12: qty 1

## 2020-06-12 MED ORDER — SUCCINYLCHOLINE CHLORIDE 200 MG/10ML IV SOSY
PREFILLED_SYRINGE | INTRAVENOUS | Status: DC | PRN
  Administered 2020-06-12: 100 mg via INTRAVENOUS

## 2020-06-12 MED ORDER — PROPOFOL 10 MG/ML IV BOLUS
INTRAVENOUS | Status: AC
  Filled 2020-06-12: qty 20

## 2020-06-12 MED ORDER — CEFAZOLIN SODIUM-DEXTROSE 1-4 GM/50ML-% IV SOLN
INTRAVENOUS | Status: DC | PRN
  Administered 2020-06-12: 2 g via INTRAVENOUS

## 2020-06-12 MED ORDER — ENOXAPARIN SODIUM 30 MG/0.3ML ~~LOC~~ SOLN
30.0000 mg | Freq: Two times a day (BID) | SUBCUTANEOUS | Status: DC
  Administered 2020-06-13 – 2020-06-14 (×4): 30 mg via SUBCUTANEOUS
  Filled 2020-06-12 (×5): qty 0.3

## 2020-06-12 MED ORDER — LABETALOL HCL 5 MG/ML IV SOLN
5.0000 mg | Freq: Once | INTRAVENOUS | Status: AC
  Administered 2020-06-12: 5 mg via INTRAVENOUS

## 2020-06-12 MED ORDER — TETANUS-DIPHTH-ACELL PERTUSSIS 5-2.5-18.5 LF-MCG/0.5 IM SUSY
0.5000 mL | PREFILLED_SYRINGE | Freq: Once | INTRAMUSCULAR | Status: AC
  Administered 2020-06-12: 0.5 mL via INTRAMUSCULAR
  Filled 2020-06-12: qty 0.5

## 2020-06-12 MED ORDER — PHENYLEPHRINE 40 MCG/ML (10ML) SYRINGE FOR IV PUSH (FOR BLOOD PRESSURE SUPPORT)
PREFILLED_SYRINGE | INTRAVENOUS | Status: DC | PRN
  Administered 2020-06-12 (×2): 80 ug via INTRAVENOUS

## 2020-06-12 MED ORDER — LIDOCAINE 2% (20 MG/ML) 5 ML SYRINGE
INTRAMUSCULAR | Status: DC | PRN
  Administered 2020-06-12: 40 mg via INTRAVENOUS

## 2020-06-12 MED ORDER — ACETAMINOPHEN 325 MG PO TABS: 325.0000 mg | ORAL_TABLET | Freq: Once | ORAL | Status: DC | PRN

## 2020-06-12 MED ORDER — MEPERIDINE HCL 25 MG/ML IJ SOLN: 6.2500 mg | INTRAMUSCULAR | Status: DC | PRN

## 2020-06-12 MED ORDER — HYDROMORPHONE HCL 1 MG/ML IJ SOLN
0.5000 mg | Freq: Once | INTRAMUSCULAR | Status: AC
  Administered 2020-06-12: 0.5 mg via INTRAVENOUS
  Filled 2020-06-12: qty 1

## 2020-06-12 MED ORDER — IOHEXOL 300 MG/ML  SOLN
100.0000 mL | Freq: Once | INTRAMUSCULAR | Status: AC | PRN
  Administered 2020-06-12: 100 mL via INTRAVENOUS

## 2020-06-12 MED ORDER — METOPROLOL TARTRATE 5 MG/5ML IV SOLN: 5.0000 mg | Freq: Four times a day (QID) | INTRAVENOUS | Status: DC | PRN

## 2020-06-12 MED ORDER — LABETALOL HCL 5 MG/ML IV SOLN
INTRAVENOUS | Status: AC
  Filled 2020-06-12: qty 4

## 2020-06-12 MED ORDER — 0.9 % SODIUM CHLORIDE (POUR BTL) OPTIME
TOPICAL | Status: DC | PRN
  Administered 2020-06-12: 1000 mL

## 2020-06-12 MED ORDER — ROCURONIUM BROMIDE 10 MG/ML (PF) SYRINGE
PREFILLED_SYRINGE | INTRAVENOUS | Status: DC | PRN
  Administered 2020-06-12: 40 mg via INTRAVENOUS
  Administered 2020-06-12: 10 mg via INTRAVENOUS

## 2020-06-12 MED ORDER — ACETAMINOPHEN 325 MG PO TABS
650.0000 mg | ORAL_TABLET | Freq: Four times a day (QID) | ORAL | Status: DC
  Administered 2020-06-12 – 2020-06-13 (×2): 650 mg via ORAL
  Filled 2020-06-12 (×2): qty 2

## 2020-06-12 MED ORDER — SODIUM CHLORIDE 0.9 % IV BOLUS
1000.0000 mL | Freq: Once | INTRAVENOUS | Status: AC
  Administered 2020-06-12: 1000 mL via INTRAVENOUS

## 2020-06-12 MED ORDER — HYDROMORPHONE HCL 1 MG/ML IJ SOLN
0.2500 mg | INTRAMUSCULAR | Status: DC | PRN
  Administered 2020-06-12 (×2): 0.5 mg via INTRAVENOUS

## 2020-06-12 MED ORDER — DEXMEDETOMIDINE (PRECEDEX) IN NS 20 MCG/5ML (4 MCG/ML) IV SYRINGE
PREFILLED_SYRINGE | INTRAVENOUS | Status: DC | PRN
  Administered 2020-06-12: 8 ug via INTRAVENOUS

## 2020-06-12 MED ORDER — MIDAZOLAM HCL 5 MG/5ML IJ SOLN
INTRAMUSCULAR | Status: DC | PRN
  Administered 2020-06-12: 2 mg via INTRAVENOUS

## 2020-06-12 MED ORDER — CEFAZOLIN SODIUM-DEXTROSE 2-4 GM/100ML-% IV SOLN
INTRAVENOUS | Status: AC
  Filled 2020-06-12: qty 100

## 2020-06-12 MED ORDER — SUGAMMADEX SODIUM 200 MG/2ML IV SOLN
INTRAVENOUS | Status: DC | PRN
  Administered 2020-06-12: 140 mg via INTRAVENOUS

## 2020-06-12 MED ORDER — KCL IN DEXTROSE-NACL 20-5-0.45 MEQ/L-%-% IV SOLN
INTRAVENOUS | Status: DC
  Filled 2020-06-12 (×2): qty 1000

## 2020-06-12 MED ORDER — LACTATED RINGERS IV SOLN: INTRAVENOUS | Status: DC | PRN

## 2020-06-12 MED ORDER — ONDANSETRON 4 MG PO TBDP: 4.0000 mg | ORAL_TABLET | Freq: Four times a day (QID) | ORAL | Status: DC | PRN

## 2020-06-12 MED ORDER — ACETAMINOPHEN 160 MG/5ML PO SOLN: 325.0000 mg | Freq: Once | ORAL | Status: DC | PRN

## 2020-06-12 MED ORDER — LACTATED RINGERS IV SOLN: INTRAVENOUS | Status: DC

## 2020-06-12 MED ORDER — DEXAMETHASONE SODIUM PHOSPHATE 10 MG/ML IJ SOLN
INTRAMUSCULAR | Status: DC | PRN
  Administered 2020-06-12: 5 mg via INTRAVENOUS

## 2020-06-12 MED ORDER — ONDANSETRON HCL 4 MG/2ML IJ SOLN: 4.0000 mg | Freq: Four times a day (QID) | INTRAMUSCULAR | Status: DC | PRN

## 2020-06-12 MED ORDER — FENTANYL CITRATE (PF) 100 MCG/2ML IJ SOLN
INTRAMUSCULAR | Status: DC | PRN
  Administered 2020-06-12 (×2): 25 ug via INTRAVENOUS
  Administered 2020-06-12: 100 ug via INTRAVENOUS

## 2020-06-12 MED ORDER — FENTANYL CITRATE (PF) 100 MCG/2ML IJ SOLN
50.0000 ug | INTRAMUSCULAR | Status: DC | PRN
  Administered 2020-06-12: 50 ug via INTRAVENOUS
  Filled 2020-06-12: qty 2

## 2020-06-12 MED ORDER — HYDROMORPHONE HCL 1 MG/ML IJ SOLN
INTRAMUSCULAR | Status: AC
  Filled 2020-06-12: qty 1

## 2020-06-12 MED ORDER — CEFAZOLIN SODIUM-DEXTROSE 2-4 GM/100ML-% IV SOLN
2.0000 g | Freq: Once | INTRAVENOUS | Status: AC
  Administered 2020-06-12: 2 g via INTRAVENOUS
  Filled 2020-06-12: qty 100

## 2020-06-12 SURGICAL SUPPLY — 59 items
BIT DRILL CALIBRTD FREE HND4.3 (BIT) ×1 IMPLANT
BIT DRILL CALIBRTD SHORT 4.9MM (BIT) ×1 IMPLANT
BIT DRILL CROWE POINT TWST 4.3 (DRILL) ×1 IMPLANT
BNDG COHESIVE 6X5 TAN STRL LF (GAUZE/BANDAGES/DRESSINGS) IMPLANT
BNDG GAUZE ELAST 4 BULKY (GAUZE/BANDAGES/DRESSINGS) IMPLANT
CANISTER SUCT 3000ML PPV (MISCELLANEOUS) IMPLANT
CHLORAPREP W/TINT 26 (MISCELLANEOUS) ×2 IMPLANT
COVER MAYO STAND STRL (DRAPES) IMPLANT
COVER PERINEAL POST (MISCELLANEOUS) ×2 IMPLANT
COVER SURGICAL LIGHT HANDLE (MISCELLANEOUS) ×2 IMPLANT
COVER WAND RF STERILE (DRAPES) ×2 IMPLANT
DRAPE HALF SHEET 40X57 (DRAPES) IMPLANT
DRAPE INCISE IOBAN 66X45 STRL (DRAPES) IMPLANT
DRAPE STERI IOBAN 125X83 (DRAPES) ×2 IMPLANT
DRAPE U-SHAPE 47X51 STRL (DRAPES) IMPLANT
DRILL CALIBRATED FREE HAND 4.3 (BIT) ×2
DRILL CALIBRATED SHORT 4.9MM (BIT) ×2
DRILL CROWE POINT TWIST 4.3 (DRILL) ×2
DRSG ADAPTIC 3X8 NADH LF (GAUZE/BANDAGES/DRESSINGS) IMPLANT
DRSG MEPILEX BORDER 4X4 (GAUZE/BANDAGES/DRESSINGS) ×2 IMPLANT
DRSG MEPILEX BORDER 4X8 (GAUZE/BANDAGES/DRESSINGS) ×4 IMPLANT
ELECT REM PT RETURN 9FT ADLT (ELECTROSURGICAL) ×2
ELECTRODE REM PT RTRN 9FT ADLT (ELECTROSURGICAL) ×1 IMPLANT
EVACUATOR 1/8 PVC DRAIN (DRAIN) IMPLANT
FACESHIELD WRAPAROUND (MASK) ×2 IMPLANT
GLOVE BIO SURGEON STRL SZ7 (GLOVE) IMPLANT
GLOVE BIO SURGEON STRL SZ8 (GLOVE) ×4 IMPLANT
GLOVE BIOGEL PI IND STRL 7.5 (GLOVE) IMPLANT
GLOVE BIOGEL PI IND STRL 8 (GLOVE) ×1 IMPLANT
GLOVE BIOGEL PI INDICATOR 7.5 (GLOVE)
GLOVE BIOGEL PI INDICATOR 8 (GLOVE) ×1
GOWN STRL REUS W/ TWL LRG LVL3 (GOWN DISPOSABLE) IMPLANT
GOWN STRL REUS W/ TWL XL LVL3 (GOWN DISPOSABLE) IMPLANT
GOWN STRL REUS W/TWL LRG LVL3 (GOWN DISPOSABLE)
GOWN STRL REUS W/TWL XL LVL3 (GOWN DISPOSABLE)
GUIDEPIN 3.0 THREADED 305MM (PIN) ×2 IMPLANT
GUIDEWIRE BALL NOSE 100CM (WIRE) ×2 IMPLANT
KIT TURNOVER KIT B (KITS) ×2 IMPLANT
NAIL FEM IM 9.3X440 RT (Nail) ×2 IMPLANT
NS IRRIG 1000ML POUR BTL (IV SOLUTION) ×2 IMPLANT
PACK GENERAL/GYN (CUSTOM PROCEDURE TRAY) ×2 IMPLANT
PAD ARMBOARD 7.5X6 YLW CONV (MISCELLANEOUS) ×4 IMPLANT
PADDING CAST SYNTHETIC 4 (CAST SUPPLIES)
PADDING CAST SYNTHETIC 4X4 STR (CAST SUPPLIES) IMPLANT
SCREW BONE 5.0X57.5MM CORTIC (Nail) ×2 IMPLANT
SCREW BONE 6.0X65 CANN HEAD (Screw) ×2 IMPLANT
SCREW CORT FT ST 5X4.7 (Screw) ×2 IMPLANT
SCREW CORTICAL HEXAGON 5.0X40 (Screw) ×2 IMPLANT
STAPLER VISISTAT 35W (STAPLE) ×2 IMPLANT
SUT MNCRL AB 3-0 PS2 27 (SUTURE) ×2 IMPLANT
SUT MNCRL AB 4-0 PS2 18 (SUTURE) IMPLANT
SUT PROLENE 3 0 PS 1 (SUTURE) IMPLANT
SUT VIC AB 0 CT1 27 (SUTURE)
SUT VIC AB 0 CT1 27XBRD ANBCTR (SUTURE) IMPLANT
SUT VIC AB 2-0 CT1 27 (SUTURE) ×1
SUT VIC AB 2-0 CT1 TAPERPNT 27 (SUTURE) ×1 IMPLANT
TOWEL GREEN STERILE (TOWEL DISPOSABLE) ×2 IMPLANT
TOWEL GREEN STERILE FF (TOWEL DISPOSABLE) ×2 IMPLANT
WATER STERILE IRR 1000ML POUR (IV SOLUTION) ×2 IMPLANT

## 2020-06-12 NOTE — ED Notes (Addendum)
Pt transported to XRay 

## 2020-06-12 NOTE — Anesthesia Preprocedure Evaluation (Signed)
Anesthesia Evaluation  Patient identified by MRN, date of birth, ID band Patient awake    Reviewed: Allergy & Precautions, NPO status , Patient's Chart, lab work & pertinent test results  Airway Mallampati: II  TM Distance: >3 FB Neck ROM: Full    Dental  (+) Teeth Intact   Pulmonary neg pulmonary ROS,    Pulmonary exam normal breath sounds clear to auscultation       Cardiovascular negative cardio ROS   Rhythm:Regular Rate:Normal     Neuro/Psych negative neurological ROS  negative psych ROS   GI/Hepatic negative GI ROS, Neg liver ROS,   Endo/Other  negative endocrine ROS  Renal/GU negative Renal ROS     Musculoskeletal negative musculoskeletal ROS (+)   Abdominal Normal abdominal exam  (+)   Peds  Hematology negative hematology ROS (+)   Anesthesia Other Findings - Bruised, swollen lips  Reproductive/Obstetrics                             Anesthesia Physical Anesthesia Plan  ASA: I and emergent  Anesthesia Plan: General   Post-op Pain Management:    Induction: Intravenous  PONV Risk Score and Plan: 3 and Ondansetron, Dexamethasone and Midazolam  Airway Management Planned: Oral ETT  Additional Equipment: None  Intra-op Plan:   Post-operative Plan: Extubation in OR  Informed Consent: I have reviewed the patients History and Physical, chart, labs and discussed the procedure including the risks, benefits and alternatives for the proposed anesthesia with the patient or authorized representative who has indicated his/her understanding and acceptance.     Dental advisory given  Plan Discussed with: CRNA  Anesthesia Plan Comments: (Covid-19 Nucleic Acid Test Results Lab Results      Component                Value               Date                      SARSCOV2NAA              NEGATIVE            06/12/2020            Lab Results      Component                Value                Date                      WBC                      7.1                 06/12/2020                HGB                      15.3                06/12/2020                HCT                      45.0  06/12/2020                MCV                      94.3                06/12/2020                PLT                      216                 06/12/2020           )        Anesthesia Quick Evaluation

## 2020-06-12 NOTE — ED Notes (Signed)
Report given to Connie RN.

## 2020-06-12 NOTE — ED Notes (Signed)
Consent at bedside.  

## 2020-06-12 NOTE — Transfer of Care (Signed)
Immediate Anesthesia Transfer of Care Note  Patient: Alex Cox  Procedure(s) Performed: INTRAMEDULLARY (IM) NAIL FEMORAL (Right )  Patient Location: PACU  Anesthesia Type:General  Level of Consciousness: awake, alert , oriented and patient cooperative  Airway & Oxygen Therapy: Patient Spontanous Breathing and Patient connected to face mask oxygen  Post-op Assessment: Report given to RN and Post -op Vital signs reviewed and stable  Post vital signs: Reviewed and stable  Last Vitals:  Vitals Value Taken Time  BP 139/110 06/12/20 2246  Temp    Pulse 102 06/12/20 2246  Resp 21 06/12/20 2246  SpO2 98 % 06/12/20 2246  Vitals shown include unvalidated device data.  Last Pain:  Vitals:   06/12/20 1906  TempSrc:   PainSc: 10-Worst pain ever         Complications: No complications documented.

## 2020-06-12 NOTE — Anesthesia Procedure Notes (Addendum)
Procedure Name: Intubation Date/Time: 06/12/2020 8:40 PM Performed by: Cleda Daub, CRNA Pre-anesthesia Checklist: Patient identified, Emergency Drugs available, Suction available and Patient being monitored Patient Re-evaluated:Patient Re-evaluated prior to induction Oxygen Delivery Method: Circle system utilized Preoxygenation: Pre-oxygenation with 100% oxygen Induction Type: IV induction, Rapid sequence and Cricoid Pressure applied Laryngoscope Size: Mac and 4 Grade View: Grade I Tube type: Oral Tube size: 7.5 mm Number of attempts: 1 Airway Equipment and Method: Stylet and Oral airway Placement Confirmation: ETT inserted through vocal cords under direct vision,  positive ETCO2 and breath sounds checked- equal and bilateral Secured at: 23 cm Tube secured with: Tape Dental Injury: Teeth and Oropharynx as per pre-operative assessment
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
## 2020-06-12 NOTE — ED Notes (Signed)
Pt transported back from CT. OR doc @ bedside
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
## 2020-06-13 ENCOUNTER — Inpatient Hospital Stay (HOSPITAL_COMMUNITY): Payer: Medicaid Other

## 2020-06-13 ENCOUNTER — Encounter (HOSPITAL_COMMUNITY): Payer: Self-pay

## 2020-06-13 DIAGNOSIS — S7291XA Unspecified fracture of right femur, initial encounter for closed fracture: Secondary | ICD-10-CM | POA: Diagnosis present

## 2020-06-13 LAB — HIV ANTIBODY (ROUTINE TESTING W REFLEX): HIV Screen 4th Generation wRfx: NONREACTIVE

## 2020-06-13 LAB — CBC
HCT: 30 % — ABNORMAL LOW (ref 39.0–52.0)
Hemoglobin: 10.3 g/dL — ABNORMAL LOW (ref 13.0–17.0)
MCH: 32.5 pg (ref 26.0–34.0)
MCHC: 34.3 g/dL (ref 30.0–36.0)
MCV: 94.6 fL (ref 80.0–100.0)
Platelets: 150 10*3/uL (ref 150–400)
RBC: 3.17 MIL/uL — ABNORMAL LOW (ref 4.22–5.81)
RDW: 13.4 % (ref 11.5–15.5)
WBC: 7.6 10*3/uL (ref 4.0–10.5)
nRBC: 0 % (ref 0.0–0.2)

## 2020-06-13 LAB — BASIC METABOLIC PANEL
Anion gap: 14 (ref 5–15)
BUN: 10 mg/dL (ref 6–20)
CO2: 24 mmol/L (ref 22–32)
Calcium: 8.9 mg/dL (ref 8.9–10.3)
Chloride: 99 mmol/L (ref 98–111)
Creatinine, Ser: 1.22 mg/dL (ref 0.61–1.24)
GFR, Estimated: >60 mL/min (ref >60)
Glucose, Bld: 123 mg/dL — ABNORMAL HIGH (ref 70–99)
Potassium: 4.8 mmol/L (ref 3.5–5.1)
Sodium: 137 mmol/L (ref 135–145)

## 2020-06-13 LAB — COMPREHENSIVE METABOLIC PANEL
ALT: 71 U/L — ABNORMAL HIGH (ref 0–44)
AST: 153 U/L — ABNORMAL HIGH (ref 15–41)
Albumin: 3.2 g/dL — ABNORMAL LOW (ref 3.5–5.0)
Alkaline Phosphatase: 42 U/L (ref 38–126)
Anion gap: 7 (ref 5–15)
BUN: 12 mg/dL (ref 6–20)
CO2: 25 mmol/L (ref 22–32)
Calcium: 8.6 mg/dL — ABNORMAL LOW (ref 8.9–10.3)
Chloride: 99 mmol/L (ref 98–111)
Creatinine, Ser: 1.21 mg/dL (ref 0.61–1.24)
GFR, Estimated: >60 mL/min (ref >60)
Glucose, Bld: 153 mg/dL — ABNORMAL HIGH (ref 70–99)
Potassium: 4.4 mmol/L (ref 3.5–5.1)
Sodium: 131 mmol/L — ABNORMAL LOW (ref 135–145)
Total Bilirubin: 1 mg/dL (ref 0.3–1.2)
Total Protein: 5.7 g/dL — ABNORMAL LOW (ref 6.5–8.1)

## 2020-06-13 LAB — PHOSPHORUS: Phosphorus: 4 mg/dL (ref 2.5–4.6)

## 2020-06-13 LAB — MAGNESIUM: Magnesium: 1.9 mg/dL (ref 1.7–2.4)

## 2020-06-13 MED ORDER — BISACODYL 10 MG RE SUPP: 10.0000 mg | Freq: Every day | RECTAL | Status: DC | PRN

## 2020-06-13 MED ORDER — THIAMINE HCL 100 MG/ML IJ SOLN
100.0000 mg | Freq: Every day | INTRAMUSCULAR | Status: DC
  Administered 2020-06-14: 100 mg via INTRAVENOUS
  Filled 2020-06-13: qty 2

## 2020-06-13 MED ORDER — MORPHINE SULFATE (PF) 2 MG/ML IV SOLN
2.0000 mg | INTRAVENOUS | Status: DC | PRN
  Administered 2020-06-14: 2 mg via INTRAVENOUS
  Administered 2020-06-14 (×2): 4 mg via INTRAVENOUS
  Filled 2020-06-13 (×2): qty 2
  Filled 2020-06-13: qty 1

## 2020-06-13 MED ORDER — MAGNESIUM CITRATE PO SOLN: 1.0000 | Freq: Once | ORAL | Status: DC | PRN

## 2020-06-13 MED ORDER — ADULT MULTIVITAMIN W/MINERALS CH
1.0000 | ORAL_TABLET | Freq: Every day | ORAL | Status: DC
  Administered 2020-06-13 – 2020-06-16 (×3): 1 via ORAL
  Filled 2020-06-13 (×4): qty 1

## 2020-06-13 MED ORDER — METHOCARBAMOL 500 MG PO TABS
1000.0000 mg | ORAL_TABLET | Freq: Three times a day (TID) | ORAL | Status: DC
  Administered 2020-06-13 – 2020-06-16 (×9): 1000 mg via ORAL
  Filled 2020-06-13 (×10): qty 2

## 2020-06-13 MED ORDER — ONDANSETRON HCL 4 MG/2ML IJ SOLN
4.0000 mg | Freq: Four times a day (QID) | INTRAMUSCULAR | Status: DC | PRN
  Administered 2020-06-15: 4 mg via INTRAVENOUS
  Filled 2020-06-13: qty 2

## 2020-06-13 MED ORDER — LORAZEPAM 2 MG/ML IJ SOLN: 1.0000 mg | INTRAMUSCULAR | Status: AC | PRN

## 2020-06-13 MED ORDER — FOLIC ACID 1 MG PO TABS
1.0000 mg | ORAL_TABLET | Freq: Every day | ORAL | Status: DC
  Administered 2020-06-13 – 2020-06-16 (×3): 1 mg via ORAL
  Filled 2020-06-13 (×4): qty 1

## 2020-06-13 MED ORDER — ONDANSETRON HCL 4 MG PO TABS: 4.0000 mg | ORAL_TABLET | Freq: Four times a day (QID) | ORAL | Status: DC | PRN

## 2020-06-13 MED ORDER — LIDOCAINE 5 % EX PTCH
1.0000 | MEDICATED_PATCH | CUTANEOUS | Status: DC
  Administered 2020-06-13 – 2020-06-16 (×4): 1 via TRANSDERMAL
  Filled 2020-06-13 (×4): qty 1

## 2020-06-13 MED ORDER — POLYETHYLENE GLYCOL 3350 17 G PO PACK: 17.0000 g | PACK | Freq: Every day | ORAL | Status: DC | PRN

## 2020-06-13 MED ORDER — ACETAMINOPHEN 500 MG PO TABS
1000.0000 mg | ORAL_TABLET | Freq: Four times a day (QID) | ORAL | Status: DC
  Administered 2020-06-13 – 2020-06-16 (×11): 1000 mg via ORAL
  Filled 2020-06-13 (×13): qty 2

## 2020-06-13 MED ORDER — THIAMINE HCL 100 MG PO TABS
100.0000 mg | ORAL_TABLET | Freq: Every day | ORAL | Status: DC
  Administered 2020-06-13 – 2020-06-16 (×2): 100 mg via ORAL
  Filled 2020-06-13 (×4): qty 1

## 2020-06-13 MED ORDER — SPIRITUS FRUMENTI
1.0000 | Freq: Two times a day (BID) | ORAL | Status: DC
  Administered 2020-06-13: 1 via ORAL
  Filled 2020-06-13 (×5): qty 1

## 2020-06-13 MED ORDER — LORAZEPAM 1 MG PO TABS: 1.0000 mg | ORAL_TABLET | ORAL | Status: AC | PRN

## 2020-06-13 MED ORDER — SENNA 8.6 MG PO TABS
1.0000 | ORAL_TABLET | Freq: Two times a day (BID) | ORAL | Status: DC
  Administered 2020-06-13 – 2020-06-16 (×3): 8.6 mg via ORAL
  Filled 2020-06-13 (×5): qty 1
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
## 2020-06-13 NOTE — Progress Notes (Signed)
Subjective: 1 Day Post-Op Procedure(s) (LRB): INTRAMEDULLARY (IM) NAIL FEMORAL (Right)  Patient reports pain as mild to moderate.  Denies fever, chills, N/V, CP, SOB.  Ready to eat breakfast.  Admits to flatus.  Objective:   VITALS:  Temp:  [97.4 F (36.3 C)-98.5 F (36.9 C)] 98.5 F (36.9 C) (11/10 0739) Pulse Rate:  [70-109] 99 (11/10 0739) Resp:  [9-24] 18 (11/10 0739) BP: (82-141)/(56-104) 127/91 (11/10 0739) SpO2:  [90 %-100 %] 100 % (11/10 0739) Weight:  [68.9 kg] 68.9 kg (11/09 1719)  General: WDWN patient in NAD. Psych:  Appropriate mood and affect. Neuro:  A&O x 3, Moving all extremities, sensation intact to light touch HEENT:  EOMs intact Chest:  Even non-labored respirations Skin:  Dressing C/D/I, no rashes or lesions Extremities: warm/dry, mild edema to R thigh, no erythema or echymosis.  No lymphadenopathy. Pulses: Popliteus 2+ MSK:  ROM: TKE, MMT: able to perform quad set, (-) Homan's    LABS Recent Labs    06/12/20 1532 06/12/20 1553 06/13/20 0259  HGB 14.5 15.3 11.8*  WBC 7.1  --  11.1*  PLT 216  --  186   Recent Labs    06/12/20 1603 06/13/20 0259  NA 139 137  K 3.1* 4.8  CL 101 99  CO2 22 24  BUN 10 10  CREATININE 1.45* 1.22  GLUCOSE 108* 123*   Recent Labs    06/12/20 1532  INR 1.1     Assessment/Plan: 1 Day Post-Op Procedure(s) (LRB): INTRAMEDULLARY (IM) NAIL FEMORAL (Right)  WBAT R LE  Up with therapy Plan for 2 week outpatient post-op visit with Dr. Reed Breech PA-C [facility] Office:  [phone]
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
## 2020-06-13 NOTE — Progress Notes (Signed)
Notified Dr Janee Morn that ST elevation from the V Lead measures 5.4. Patient is not voicing no complaints. RN will continue to monitor.
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
## 2020-06-13 NOTE — Plan of Care (Signed)

## 2020-06-13 NOTE — Discharge Instructions (Signed)
Toni Arthurs, MD [facility]  Please read the following information regarding your care after surgery.  Medications  You only need a prescription for the narcotic pain medicine (ex. oxycodone, Percocet, Norco).  All of the other medicines listed below are available over the counter. X Aleve 2 pills twice a day for the first 3 days after surgery. X acetominophen (Tylenol) 650 mg every 4-6 hours as you need for minor to moderate pain   Narcotic pain medicine (ex. oxycodone, Percocet, Vicodin) will cause constipation.  To prevent this problem, take the following medicines while you are taking any pain medicine. X docusate sodium (Colace) 100 mg twice a day X senna (Senokot) 2 tablets twice a day   Weight Bearing X Bear weight when you are able on your operated leg or foot.   Cast / Splint / Dressing X Keep your splint, cast or dressing clean and dry.  Don't put anything (coat hanger, pencil, etc) down inside of it.  If it gets damp, use a hair dryer on the cool setting to dry it.  If it gets soaked, call the office to schedule an appointment for a cast change.    After your dressing, cast or splint is removed; you may shower, but do not soak or scrub the wound.  Allow the water to run over it, and then gently pat it dry.  Swelling It is normal for you to have swelling where you had surgery.  To reduce swelling and pain, keep your toes above your nose for at least 3 days after surgery.  It may be necessary to keep your foot or leg elevated for several weeks.  If it hurts, it should be elevated.  Follow Up Call my office at [phone] when you are discharged from the hospital or surgery center to schedule an appointment to be seen two weeks after surgery.  Call my office at [phone] if you develop a fever >101.5 F, nausea, vomiting, bleeding from the surgical site or severe pain.

## 2020-06-13 NOTE — Anesthesia Postprocedure Evaluation (Signed)
Anesthesia Post Note  Patient: Alex Cox  Procedure(s) Performed: INTRAMEDULLARY (IM) NAIL FEMORAL (Right )     Patient location during evaluation: PACU Anesthesia Type: General Level of consciousness: awake and alert Pain management: pain level controlled Vital Signs Assessment: post-procedure vital signs reviewed and stable Respiratory status: spontaneous breathing, nonlabored ventilation, respiratory function stable and patient connected to nasal cannula oxygen Cardiovascular status: blood pressure returned to baseline and stable Postop Assessment: no apparent nausea or vomiting Anesthetic complications: no   No complications documented.  Last Vitals:  Vitals:   06/13/20 0015 06/13/20 0016  BP: (!) 124/101 (!) 124/101  Pulse: 70 81  Resp: 10 (!) 9  Temp:  (!) 36.3 C  SpO2: 100% 99%    Last Pain:  Vitals:   06/13/20 0001  TempSrc:   PainSc: 4                  Shelton Silvas
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
## 2020-06-13 NOTE — Plan of Care (Signed)
  Problem: Activity: Goal: Ability to ambulate and perform ADLs will improve Outcome: Progressing   Problem: Clinical Measurements: Goal: Postoperative complications will be avoided or minimized Outcome: Progressing   Problem: Self-Concept: Goal: Ability to maintain and perform role responsibilities to the fullest extent possible will improve Outcome: Progressing   Problem: Pain Management: Goal: Pain level will decrease Outcome: Progressing   

## 2020-06-13 NOTE — Progress Notes (Signed)
Notified Dr Janee Morn that pt's significant other Morrie Sheldon called and stated pt has a seizure disorder and HTN- on meds for both. She states she does not know the names of the meds. She stated she would get someone to go to the house in the morning and get the medications. Patient has been placed on seizure precautions with seizure pads placed on the bed and suction set up in the room. RN will continue to monitor.

## 2020-06-14 ENCOUNTER — Encounter (HOSPITAL_COMMUNITY): Payer: Self-pay | Admitting: Orthopedic Surgery

## 2020-06-14 LAB — CBC
HCT: 24.7 % — ABNORMAL LOW (ref 39.0–52.0)
Hemoglobin: 8.6 g/dL — ABNORMAL LOW (ref 13.0–17.0)
MCH: 32.3 pg (ref 26.0–34.0)
MCHC: 34.8 g/dL (ref 30.0–36.0)
MCV: 92.9 fL (ref 80.0–100.0)
Platelets: 122 10*3/uL — ABNORMAL LOW (ref 150–400)
RBC: 2.66 MIL/uL — ABNORMAL LOW (ref 4.22–5.81)
RDW: 13 % (ref 11.5–15.5)
WBC: 8.8 10*3/uL (ref 4.0–10.5)
nRBC: 0 % (ref 0.0–0.2)

## 2020-06-14 MED ORDER — OXYCODONE HCL 5 MG PO TABS
10.0000 mg | ORAL_TABLET | ORAL | Status: DC | PRN
  Administered 2020-06-14 – 2020-06-15 (×5): 15 mg via ORAL
  Administered 2020-06-16: 10 mg via ORAL
  Filled 2020-06-14 (×2): qty 3
  Filled 2020-06-14: qty 2
  Filled 2020-06-14 (×4): qty 3

## 2020-06-14 MED ORDER — OXYCODONE HCL 5 MG PO TABS: 5.0000 mg | ORAL_TABLET | ORAL | Status: DC | PRN

## 2020-06-14 NOTE — TOC Progression Note (Signed)
Transition of Care [facility]) - Progression Note    Patient Details  Name: Alex Cox MRN: [id number] Date of Birth: [birth year]  [facility]) CM/SW Contact  Glennon Mac, RN Phone Number: 06/14/2020, 3:41 PM  Clinical Narrative: Pt for possible discharge home tomorrow with family to assist at home. Able to secure HHPT only with [facility]; agency unable to provide [facility] services.  Referral to [facility] for all recommended DME; WC, RW, and 3 in 1 to be delivered to bedside prior to dc.  Pt agreeable to all discharge arrangements.      Expected Discharge Plan: Home w Home Health Services Barriers to Discharge: Continued Medical Work up  Expected Discharge Plan and Services Expected Discharge Plan: Home w Home Health Services   Discharge Planning Services: CM Consult Post Acute Care Choice: Home Health Living arrangements for the past 2 months: Apartment                 DME Arranged: 3-N-1, Walker rolling, Wheelchair manual   Date DME Agency Contacted: 06/14/20 Time DME Agency Contacted: 1540 Representative spoke with at DME Agency: Texted to [employer] Rep HH Arranged: PT HH Agency: [facility] Date HH Agency Contacted: 06/14/20 Time HH Agency Contacted: 1540 Representative spoke with at [facility] Agency: Alcario Drought   Social Determinants of Health (SDOH) Interventions    Readmission Risk Interventions Readmission Risk Prevention Plan 06/14/2020  Post Dischage Appt Complete  Medication Screening Complete  Transportation Screening Complete   Quintella Baton, RN, BSN  Trauma/Neuro ICU Case Manager [phone]

## 2020-06-14 NOTE — Progress Notes (Signed)
Subjective: 2 Days Post-Op Procedure(s) (LRB): INTRAMEDULLARY (IM) NAIL FEMORAL (Right)  Patient reports pain as mild to moderate.  Tolerating POs well. Admits to flatus.  Denies fever, chills, N/V, CP, SOB  Objective:   VITALS:  Temp:  [98.1 F (36.7 C)-98.9 F (37.2 C)] 98.5 F (36.9 C) (11/11 0757) Pulse Rate:  [79-108] 98 (11/11 0757) Resp:  [16-18] 17 (11/11 0757) BP: (115-133)/(86-93) 133/91 (11/11 0757) SpO2:  [99 %-100 %] 100 % (11/11 0757)  General: WDWN patient in NAD. Psych:  Appropriate mood and affect. Neuro:  A&O x 3, Moving all extremities, sensation intact to light touch HEENT:  EOMs intact Chest:  Even non-labored respirations Skin: Dressing C/D/I, no rashes or lesions Extremities: warm/dry, Mild edema to R thigh, no erythema or echymosis.  No lymphadenopathy. Pulses: Popliteus 2+ MSK:  ROM: TKE, MMT: able to perform quad set, (-) Homan's    LABS Recent Labs    06/12/20 1532 06/12/20 1532 06/12/20 1553 06/13/20 0259 06/13/20 0259 06/13/20 1013 06/14/20 0139  HGB 14.5  --  15.3 11.8*  --  10.3* 8.6*  WBC 7.1   < >  --  11.1*   < > 7.6 8.8  PLT 216   < >  --  186   < > 150 122*   < > = values in this interval not displayed.   Recent Labs    06/13/20 0259 06/13/20 1013  NA 137 131*  K 4.8 4.4  CL 99 99  CO2 24 25  BUN 10 12  CREATININE 1.22 1.21  GLUCOSE 123* 153*   Recent Labs    06/12/20 1532  INR 1.1     Assessment/Plan: 2 Days Post-Op Procedure(s) (LRB): INTRAMEDULLARY (IM) NAIL FEMORAL (Right)  WBAT R LE Up with therapy Plan for 2 week outpatient post-op visit with Dr. Victorino Dike.  Alfredo Martinez PA-C [facility] Office:  [phone]

## 2020-06-14 NOTE — TOC CAGE-AID Note (Signed)
Transition of Care [facility]) - CAGE-AID Screening   Patient Details  Name: Alex Cox MRN: [id number] Date of Birth: 06-30-1991  Transition of Care [facility]) CM/SW Contact:    Emeterio Reeve, [place] Phone Number: 06/14/2020, 3:39 PM   Clinical Narrative:  CSW met with pt at bedside. CSW introduced self and explained her role at the hospital.  Pt reports alcohol use of about every other day. Pt reports he drinks about a 12 pack of beers and/or liquor. Pt denies substance use. Pt did not want resources at this time. Pt does not believe he needs help stopping use and said he can stop on his own.   CAGE-AID Screening:    Have You Ever Felt You Ought to Cut Down on Your Drinking or Drug Use?: No Have People Annoyed You By Critizing Your Drinking Or Drug Use?: Yes Have You Felt Bad Or Guilty About Your Drinking Or Drug Use?: No Have You Ever Had a Drink or Used Drugs First Thing In The Morning to Steady Your Nerves or to Get Rid of a Hangover?: No CAGE-AID Score: 1  Substance Abuse Education Offered: Yes  Substance abuse interventions: Patient Counseling   Emeterio Reeve, Latanya Presser, [place] Social Worker [phone]
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
## 2020-06-15 ENCOUNTER — Inpatient Hospital Stay (HOSPITAL_COMMUNITY): Payer: Medicaid Other

## 2020-06-15 LAB — CBC
HCT: 20 % — ABNORMAL LOW (ref 39.0–52.0)
HCT: 21.9 % — ABNORMAL LOW (ref 39.0–52.0)
Hemoglobin: 7 g/dL — ABNORMAL LOW (ref 13.0–17.0)
Hemoglobin: 7.7 g/dL — ABNORMAL LOW (ref 13.0–17.0)
MCH: 32.7 pg (ref 26.0–34.0)
MCH: 32.2 pg (ref 26.0–34.0)
MCHC: 35 g/dL (ref 30.0–36.0)
MCHC: 35.2 g/dL (ref 30.0–36.0)
MCV: 93.5 fL (ref 80.0–100.0)
MCV: 91.6 fL (ref 80.0–100.0)
Platelets: 124 10*3/uL — ABNORMAL LOW (ref 150–400)
Platelets: 131 10*3/uL — ABNORMAL LOW (ref 150–400)
RBC: 2.14 MIL/uL — ABNORMAL LOW (ref 4.22–5.81)
RBC: 2.39 MIL/uL — ABNORMAL LOW (ref 4.22–5.81)
RDW: 12.6 % (ref 11.5–15.5)
RDW: 13 % (ref 11.5–15.5)
WBC: 8.7 10*3/uL (ref 4.0–10.5)
WBC: 9.4 10*3/uL (ref 4.0–10.5)
nRBC: 0 % (ref 0.0–0.2)
nRBC: 0 % (ref 0.0–0.2)

## 2020-06-15 LAB — TYPE AND SCREEN
ABO/RH(D): A POS
Antibody Screen: NEGATIVE

## 2020-06-15 LAB — PREPARE RBC (CROSSMATCH)

## 2020-06-15 MED ORDER — MORPHINE SULFATE (PF) 2 MG/ML IV SOLN: 2.0000 mg | Freq: Three times a day (TID) | INTRAVENOUS | Status: DC | PRN

## 2020-06-15 MED ORDER — POLYETHYLENE GLYCOL 3350 17 G PO PACK
17.0000 g | PACK | Freq: Every day | ORAL | Status: DC
  Administered 2020-06-16: 17 g via ORAL
  Filled 2020-06-15 (×2): qty 1

## 2020-06-15 MED ORDER — TRAMADOL HCL 50 MG PO TABS
50.0000 mg | ORAL_TABLET | Freq: Four times a day (QID) | ORAL | Status: DC
  Administered 2020-06-15 – 2020-06-16 (×5): 50 mg via ORAL
  Filled 2020-06-15 (×5): qty 1

## 2020-06-15 MED ORDER — IOHEXOL 300 MG/ML  SOLN
100.0000 mL | Freq: Once | INTRAMUSCULAR | Status: AC | PRN
  Administered 2020-06-15: 61 mL via INTRAVENOUS

## 2020-06-15 MED ORDER — SODIUM CHLORIDE 0.9% IV SOLUTION: Freq: Once | INTRAVENOUS | Status: AC
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
## 2020-06-15 NOTE — Plan of Care (Signed)
No acute changes since the previous night that I took care of this patient. Pain being controlled with prn meds as ordered. Will continue to monitor and continue current POC.

## 2020-06-15 NOTE — TOC Progression Note (Signed)
Transition of Care [facility]) - Progression Note    Patient Details  Name: Maxine Huynh MRN: [id number] Date of Birth: 1991-04-08  Transition of Care [facility]) CM/SW Contact  Glennon Mac, RN Phone Number: 06/15/2020, 4:55 PM  Clinical Narrative:   Notified by [facility] that pt has only Family Medicaid, which has no home health benefit.  Will make referral to [facility] on [facility] for PT/OT follow up.      Expected Discharge Plan: OP Rehab Barriers to Discharge: Continued Medical Work up  Expected Discharge Plan and Services Expected Discharge Plan: OP Rehab   Discharge Planning Services: CM Consult Post Acute Care Choice: Home Health Living arrangements for the past 2 months: Apartment                 DME Arranged: 3-N-1, Walker rolling, Wheelchair manual   Date DME Agency Contacted: 06/14/20 Time DME Agency Contacted: 1540 Representative spoke with at DME Agency: Texted to [employer] Rep   Social Determinants of Health (SDOH) Interventions    Readmission Risk Interventions Readmission Risk Prevention Plan 06/14/2020  Post Dischage Appt Complete  Medication Screening Complete  Transportation Screening Complete   Quintella Baton, RN, BSN  Trauma/Neuro ICU Case Manager [phone]

## 2020-06-15 NOTE — Progress Notes (Signed)
Orthopedic Tech Progress Note Patient Details:  Alex Cox [birth year] [id number]  Ortho Devices Type of Ortho Device: Crutches Ortho Device/Splint Interventions: Adjustment   Post Interventions Patient Tolerated: Other (comment) Instructions Provided: Other (comment)   Donald Pore 06/15/2020, 3:21 PM

## 2020-06-16 LAB — TYPE AND SCREEN
ABO/RH(D): A POS
Antibody Screen: NEGATIVE
Unit division: 0

## 2020-06-16 LAB — BPAM RBC
Blood Product Expiration Date: 202111192359
ISSUE DATE / TIME: 202111121527
Unit Type and Rh: 6200

## 2020-06-16 MED ORDER — OXYCODONE HCL 5 MG PO TABS: 5.0000 mg | ORAL_TABLET | Freq: Four times a day (QID) | ORAL | 0 refills | Status: AC | PRN

## 2020-06-16 NOTE — Plan of Care (Signed)
  Problem: Education: Goal: Verbalization of understanding the information provided (i.e., activity precautions, restrictions, etc) will improve Outcome: Adequate for Discharge Goal: Individualized Educational Video(s) Outcome: Adequate for Discharge   Problem: Activity: Goal: Ability to ambulate and perform ADLs will improve Outcome: Adequate for Discharge   Problem: Clinical Measurements: Goal: Postoperative complications will be avoided or minimized Outcome: Adequate for Discharge   Problem: Self-Concept: Goal: Ability to maintain and perform role responsibilities to the fullest extent possible will improve Outcome: Adequate for Discharge   Problem: Pain Management: Goal: Pain level will decrease Outcome: Adequate for Discharge   Problem: Acute Rehab PT Goals(only PT should resolve) Goal: Pt Will Go Supine/Side To Sit Outcome: Adequate for Discharge Goal: Patient Will Transfer Sit To/From Stand Outcome: Adequate for Discharge Goal: Pt Will Ambulate Outcome: Adequate for Discharge   Problem: Acute Rehab OT Goals (only OT should resolve) Goal: Pt. Will Perform Grooming Outcome: Adequate for Discharge Goal: Pt. Will Perform Lower Body Bathing Outcome: Adequate for Discharge Goal: Pt. Will Perform Lower Body Dressing Outcome: Adequate for Discharge Goal: Pt. Will Transfer To Toilet Outcome: Adequate for Discharge Goal: Pt. Will Perform Toileting-Clothing Manipulation Outcome: Adequate for Discharge Goal: Pt. Will Perform Tub/Shower Transfer Outcome: Adequate for Discharge Goal: OT Additional ADL Goal #1 Outcome: Adequate for Discharge

## 2020-06-16 NOTE — Plan of Care (Signed)
Patient received one unit of blood today and Hgb is up to 7.7 from 7.0 this morning. No other acute changes with this patient. Plan is for discharge home tomorrow. Will continue to monitor and continue current POC.

## 2020-06-16 NOTE — Progress Notes (Signed)
Pt was given his AVS discharge summary and went over with him. IV was removed with catheter intact. Pt equipment in room to go home with him. Pt had no further questions.
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
## 2020-06-18 ENCOUNTER — Telehealth: Payer: Self-pay

## 2020-06-18 NOTE — Telephone Encounter (Signed)
Transition Care Management Unsuccessful Follow-up Telephone Call  Date of discharge and from where:  06/16/2020, [facility]   Attempts:  1st Attempt  Reason for unsuccessful TCM follow-up call:  Left voice message on # [phone] requesting a call back to this CM. Call also placed to # [phone], voicemail full, unable to leave a message.  He has an appointment at [facility]  06/20/2020

## 2020-06-19 ENCOUNTER — Telehealth: Payer: Self-pay

## 2020-06-19 NOTE — Telephone Encounter (Signed)
Transition Care Management Unsuccessful Follow-up Telephone Call  Date of discharge and from where:  06/16/2020, [facility]  Attempts:  2nd Attempt  Reason for unsuccessful TCM follow-up call:  Left voice message on # [phone]  Call placed to # [phone], voicemail full, unable to leave a message.  The patient has an appointment tomorrow - 06/20/2020 @ [facility].

## 2020-06-20 ENCOUNTER — Other Ambulatory Visit: Payer: Self-pay

## 2020-06-20 ENCOUNTER — Encounter: Payer: Self-pay | Admitting: Family Medicine

## 2020-06-20 ENCOUNTER — Ambulatory Visit: Payer: Self-pay | Attending: Physician Assistant | Admitting: Physician Assistant

## 2020-07-02 ENCOUNTER — Telehealth: Payer: Self-pay

## 2020-07-02 NOTE — Telephone Encounter (Signed)
Copied from CRM [id number]. Topic: General - Other >> Jul 02, 2020 10:02 AM Gwenlyn Fudge wrote: Reason for CRM: Pts significant other is requesting to have pt put on a cancellation list in case a hospital f/u is available sooner. Please advise.

## 2020-07-10 ENCOUNTER — Inpatient Hospital Stay: Payer: Medicaid Other | Admitting: Physician Assistant

## 2020-07-26 ENCOUNTER — Inpatient Hospital Stay: Payer: Medicaid Other | Admitting: Physician Assistant

## 2020-08-21 ENCOUNTER — Ambulatory Visit: Payer: Medicaid Other | Admitting: Critical Care Medicine
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
## 2021-12-16 ENCOUNTER — Emergency Department (HOSPITAL_COMMUNITY): Payer: Self-pay

## 2021-12-16 ENCOUNTER — Encounter (HOSPITAL_COMMUNITY): Payer: Self-pay

## 2021-12-16 ENCOUNTER — Emergency Department (HOSPITAL_COMMUNITY)
Admission: EM | Admit: 2021-12-16 | Discharge: 2021-12-17 | Disposition: A | Discharge status: Home / Self Care | Payer: Self-pay | Attending: Emergency Medicine | Admitting: Emergency Medicine

## 2021-12-16 ENCOUNTER — Other Ambulatory Visit: Payer: Self-pay

## 2021-12-16 DIAGNOSIS — S92425A Nondisplaced fracture of distal phalanx of left great toe, initial encounter for closed fracture: Secondary | ICD-10-CM | POA: Insufficient documentation

## 2021-12-16 DIAGNOSIS — W228XXA Striking against or struck by other objects, initial encounter: Secondary | ICD-10-CM | POA: Insufficient documentation

## 2021-12-16 NOTE — ED Triage Notes (Signed)
Pt states that he hit his left great toe on a tree stump today.  ?

## 2021-12-17 MED ORDER — HYDROCODONE-ACETAMINOPHEN 5-325 MG PO TABS
1.0000 | ORAL_TABLET | Freq: Once | ORAL | Status: AC
  Administered 2021-12-17: 1 via ORAL
  Filled 2021-12-17: qty 1

## 2021-12-17 NOTE — Discharge Instructions (Addendum)
Please use Tylenol or ibuprofen for pain.  You may use 600 mg ibuprofen every 6 hours or 1000 mg of Tylenol every 6 hours.  You may choose to alternate between the 2.  This would be most effective.  Not to exceed 4 g of Tylenol within 24 hours.  Not to exceed 3200 mg ibuprofen 24 hours. ? ?You can use the post-op shoe as needed, but you can also just use a shoe with a wide toe base. You can continue to bear weight as tolerated. Follow up with orthopedics as needed if you have any questions or concerns. ? ?

## 2021-12-17 NOTE — ED Provider Notes (Signed)
?[facility]-EMERGENCY DEPT ?Provider Note ? ? ?CSN: [id number] ?Arrival date & time: 12/16/21  2321 ? ?  ? ?History ? ?Chief Complaint  ?Patient presents with  ? Toe Injury  ? ? ?Alex Cox is a 31 y.o. male with noncontributory past medical history presents with concern for acute left great toe pain after kicking a tree stump earlier today.  Patient denies numbness, tingling, he has not take anything for pain prior to arrival.  Denies any other injuries, fall, head injury. ? ?HPI ? ?  ? ?Home Medications ?Prior to Admission medications   ?Medication Sig Start Date End Date Taking? Authorizing Provider  ?hydrochlorothiazide (HYDRODIURIL) 12.5 MG tablet Take 1 tablet (12.5 mg total) by mouth daily. MUST KEEP APPT ON 02/11/19 FOR FURTHER REFILLS 02/03/19   Fulp, Cammie, MD  ?oxyCODONE (OXY IR/ROXICODONE) 5 MG immediate release tablet Take 1 tablet (5 mg total) by mouth every 6 (six) hours as needed for severe pain. 06/16/20   Luretha Murphy, MD  ?   ? ?Allergies    ?Patient has no known allergies.   ? ?Review of Systems   ?Review of Systems  ?All other systems reviewed and are negative. ? ?Physical Exam ?Updated Vital Signs ?BP 106/74   Pulse 71   Temp 97.9 ?F (36.6 ?C) (Oral)   Resp 16   Ht 6' (1.829 m)   Wt 68 kg   SpO2 97%   BMI 20.34 kg/m?  ?Physical Exam ?Vitals and nursing note reviewed.  ?Constitutional:   ?   General: He is not in acute distress. ?   Appearance: Normal appearance.  ?HENT:  ?   Head: Normocephalic and atraumatic.  ?Eyes:  ?   General:     ?   Right eye: No discharge.     ?   Left eye: No discharge.  ?Cardiovascular:  ?   Rate and Rhythm: Normal rate and regular rhythm.  ?   Pulses: Normal pulses.  ?Pulmonary:  ?   Effort: Pulmonary effort is normal. No respiratory distress.  ?Musculoskeletal:     ?   General: No deformity.  ?   Comments: Patient with tenderness to palpation, bruising noted of the left distal phalanx.  No significant tenderness palpation of the  rest of the left foot.  Intact strength to flexion extension of the left great toe.  ?Skin: ?   General: Skin is warm and dry.  ?   Capillary Refill: Capillary refill takes less than 2 seconds.  ?Neurological:  ?   Mental Status: He is alert and oriented to person, place, and time.  ?Psychiatric:     ?   Mood and Affect: Mood normal.     ?   Behavior: Behavior normal.  ? ? ?ED Results / Procedures / Treatments   ?Labs ?(all labs ordered are listed, but only abnormal results are displayed) ?Labs Reviewed - No data to display ? ?EKG ?None ? ?Radiology ?DG Toe Great Left ? ?Result Date: 12/16/2021 ?CLINICAL DATA:  Injury. EXAM: LEFT GREAT TOE COMPARISON:  None. FINDINGS: There is an acute nondisplaced fracture through the base of the first distal phalanx. There is intra-articular extension. There is no dislocation. Joint spaces are well maintained. Soft tissues are within normal limits. IMPRESSION: Acute nondisplaced fracture base of the first distal phalanx. Electronically Signed   By: Darliss Cheney M.D.   On: 12/16/2021 23:55   ? ?Procedures ?Procedures  ? ? ?Medications Ordered in ED ?Medications  ?HYDROcodone-acetaminophen (NORCO/VICODIN) 5-325 MG  per tablet 1 tablet (has no administration in time range)  ? ? ?ED Course/ Medical Decision Making/ A&P ?  ?                        ?Medical Decision Making ?Amount and/or Complexity of Data Reviewed ?Radiology: ordered. ? ?Risk ?Prescription drug management. ? ? ?Is an overall well-appearing 31 year old male who presents with acute left great toe pain.  My emergent differential diagnosis includes acute fracture, dislocation versus other.  Is not an exhaustive differential. ? ?I independently interpreted imaging including plain film radiograph of the left foot which shows acute left distal phalanx fracture which is nondisplaced.. I agree with the radiologist interpretation.  Patient having some continued pain, we will provide pain medication prior to discharge, but  discussed he can use over-the-counter pain medication in the coming days.  Discussed he can either ambulate with a shoe with a wide toe base, hard sole, but will provide postop shoe as an option, discussed buddy taping.  Discussed rest, ice, compression, elevation.  He can follow-up with orthopedics as needed.  Discussed expected sequelae and healing process for nondisplaced fracture of the great toe. ? ?Patient discharged in stable condition at this time, extensive return precautions given. ? ?Final Clinical Impression(s) / ED Diagnoses ?Final diagnoses:  ?Closed nondisplaced fracture of distal phalanx of left great toe, initial encounter  ? ? ?Rx / DC Orders ?ED Discharge Orders   ? ? None  ? ?  ? ? ?  ?Olene Floss, PA-C ?12/17/21 0019 ? ?  ?Sabas Sous, MD ?12/17/21 [phone] ? ?
